# Patient Record
Sex: Female | Born: 1985 | Race: Black or African American | Hispanic: No | Marital: Married | Smoking: Former smoker
Health system: Southern US, Community
[De-identification: ages and names within clinical notes are randomized; demographics above are authoritative.]

## PROBLEM LIST (undated history)

## (undated) ENCOUNTER — Ambulatory Visit: Admission: EM | Payer: Self-pay | Source: Home / Self Care

## (undated) DIAGNOSIS — Z9049 Acquired absence of other specified parts of digestive tract: Secondary | ICD-10-CM

## (undated) DIAGNOSIS — E669 Obesity, unspecified: Secondary | ICD-10-CM

## (undated) DIAGNOSIS — F419 Anxiety disorder, unspecified: Secondary | ICD-10-CM

## (undated) DIAGNOSIS — D649 Anemia, unspecified: Secondary | ICD-10-CM

## (undated) HISTORY — DX: Morbid (severe) obesity due to excess calories: E66.01

## (undated) HISTORY — PX: CHOLECYSTECTOMY: SHX55

## (undated) HISTORY — PX: WISDOM TOOTH EXTRACTION: SHX21

## (undated) HISTORY — DX: Anemia, unspecified: D64.9

---
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
## 2007-08-30 ENCOUNTER — Emergency Department (HOSPITAL_COMMUNITY): Admission: EM | Admit: 2007-08-30 | Discharge: 2007-08-30 | Payer: Self-pay | Admitting: Emergency Medicine

## 2008-10-27 ENCOUNTER — Inpatient Hospital Stay (HOSPITAL_COMMUNITY): Admission: RE | Admit: 2008-10-27 | Discharge: 2008-10-30 | Payer: Self-pay | Admitting: Obstetrics and Gynecology

## 2008-11-23 ENCOUNTER — Emergency Department (HOSPITAL_COMMUNITY): Admission: EM | Admit: 2008-11-23 | Discharge: 2008-11-23 | Payer: Self-pay | Admitting: Emergency Medicine

## 2009-03-27 ENCOUNTER — Emergency Department (HOSPITAL_COMMUNITY): Admission: EM | Admit: 2009-03-27 | Discharge: 2009-03-27 | Payer: Self-pay | Admitting: Emergency Medicine

## 2009-04-30 ENCOUNTER — Emergency Department (HOSPITAL_COMMUNITY): Admission: EM | Admit: 2009-04-30 | Discharge: 2009-04-30 | Payer: Self-pay | Admitting: Emergency Medicine

## 2009-07-22 ENCOUNTER — Ambulatory Visit (HOSPITAL_COMMUNITY): Admission: RE | Admit: 2009-07-22 | Discharge: 2009-07-22 | Payer: Self-pay | Admitting: Obstetrics & Gynecology

## 2009-09-26 ENCOUNTER — Inpatient Hospital Stay (HOSPITAL_COMMUNITY): Admission: AD | Admit: 2009-09-26 | Discharge: 2009-09-26 | Payer: Self-pay | Admitting: Family Medicine

## 2009-09-27 ENCOUNTER — Ambulatory Visit: Payer: Self-pay | Admitting: Advanced Practice Midwife

## 2009-09-27 ENCOUNTER — Inpatient Hospital Stay (HOSPITAL_COMMUNITY): Admission: AD | Admit: 2009-09-27 | Discharge: 2009-09-29 | Payer: Self-pay | Admitting: Obstetrics and Gynecology

## 2010-04-13 ENCOUNTER — Emergency Department (HOSPITAL_COMMUNITY)
Admission: EM | Admit: 2010-04-13 | Discharge: 2010-04-13 | Payer: Self-pay | Source: Home / Self Care | Admitting: Emergency Medicine

## 2010-05-29 ENCOUNTER — Emergency Department (HOSPITAL_COMMUNITY)
Admission: EM | Admit: 2010-05-29 | Discharge: 2010-05-29 | Payer: Self-pay | Source: Home / Self Care | Admitting: Emergency Medicine

## 2010-07-02 ENCOUNTER — Encounter: Payer: Self-pay | Admitting: Obstetrics & Gynecology

## 2010-08-22 LAB — POCT I-STAT, CHEM 8
BUN: 11 mg/dL (ref 6–23)
Calcium, Ion: 1.15 mmol/L (ref 1.12–1.32)
Chloride: 105 mEq/L (ref 96–112)
Creatinine, Ser: 0.7 mg/dL (ref 0.4–1.2)
Glucose, Bld: 105 mg/dL — ABNORMAL HIGH (ref 70–99)
HCT: 34 % — ABNORMAL LOW (ref 36.0–46.0)
Hemoglobin: 11.6 g/dL — ABNORMAL LOW (ref 12.0–15.0)
Potassium: 3.7 mEq/L (ref 3.5–5.1)
Sodium: 140 mEq/L (ref 135–145)
TCO2: 28 mmol/L (ref 0–100)

## 2010-08-22 LAB — URINALYSIS, ROUTINE W REFLEX MICROSCOPIC
Bilirubin Urine: NEGATIVE
Glucose, UA: NEGATIVE mg/dL
Ketones, ur: NEGATIVE mg/dL
Nitrite: NEGATIVE
Protein, ur: 100 mg/dL — AB
Specific Gravity, Urine: 1.025 (ref 1.005–1.030)
Urobilinogen, UA: 0.2 mg/dL (ref 0.0–1.0)
pH: 6 (ref 5.0–8.0)

## 2010-08-22 LAB — RAPID STREP SCREEN (MED CTR MEBANE ONLY): Streptococcus, Group A Screen (Direct): NEGATIVE

## 2010-08-22 LAB — URINE MICROSCOPIC-ADD ON

## 2010-08-22 LAB — POCT PREGNANCY, URINE: Preg Test, Ur: NEGATIVE

## 2010-08-29 LAB — CBC
HCT: 37.1 % (ref 36.0–46.0)
Hemoglobin: 12.7 g/dL (ref 12.0–15.0)
MCHC: 34.3 g/dL (ref 30.0–36.0)
MCV: 86.7 fL (ref 78.0–100.0)
Platelets: 373 10*3/uL (ref 150–400)
RBC: 4.28 MIL/uL (ref 3.87–5.11)
RDW: 15 % (ref 11.5–15.5)
WBC: 11.7 10*3/uL — ABNORMAL HIGH (ref 4.0–10.5)

## 2010-08-29 LAB — RPR: RPR Ser Ql: NONREACTIVE

## 2010-09-13 LAB — COMPREHENSIVE METABOLIC PANEL
ALT: 10 U/L (ref 0–35)
AST: 13 U/L (ref 0–37)
Albumin: 2.8 g/dL — ABNORMAL LOW (ref 3.5–5.2)
Alkaline Phosphatase: 57 U/L (ref 39–117)
BUN: 6 mg/dL (ref 6–23)
CO2: 23 mEq/L (ref 19–32)
Calcium: 8.9 mg/dL (ref 8.4–10.5)
Chloride: 105 mEq/L (ref 96–112)
Creatinine, Ser: 0.53 mg/dL (ref 0.4–1.2)
GFR calc Af Amer: >60 mL/min (ref >60)
GFR calc non Af Amer: >60 mL/min (ref >60)
Glucose, Bld: 87 mg/dL (ref 70–99)
Potassium: 3.9 mEq/L (ref 3.5–5.1)
Sodium: 135 mEq/L (ref 135–145)
Total Bilirubin: 0.3 mg/dL (ref 0.3–1.2)
Total Protein: 6.9 g/dL (ref 6.0–8.3)

## 2010-09-13 LAB — URINE MICROSCOPIC-ADD ON

## 2010-09-13 LAB — URINALYSIS, ROUTINE W REFLEX MICROSCOPIC
Bilirubin Urine: NEGATIVE
Glucose, UA: NEGATIVE mg/dL
Hgb urine dipstick: NEGATIVE
Ketones, ur: NEGATIVE mg/dL
Nitrite: NEGATIVE
Protein, ur: NEGATIVE mg/dL
Specific Gravity, Urine: 1.02 (ref 1.005–1.030)
Urobilinogen, UA: 0.2 mg/dL (ref 0.0–1.0)
pH: 7 (ref 5.0–8.0)

## 2010-09-13 LAB — CBC
HCT: 32.6 % — ABNORMAL LOW (ref 36.0–46.0)
Hemoglobin: 11.3 g/dL — ABNORMAL LOW (ref 12.0–15.0)
MCHC: 34.8 g/dL (ref 30.0–36.0)
MCV: 81.6 fL (ref 78.0–100.0)
Platelets: 325 10*3/uL (ref 150–400)
RBC: 4 MIL/uL (ref 3.87–5.11)
RDW: 16 % — ABNORMAL HIGH (ref 11.5–15.5)
WBC: 9 10*3/uL (ref 4.0–10.5)

## 2010-09-13 LAB — DIFFERENTIAL
Basophils Absolute: 0.1 10*3/uL (ref 0.0–0.1)
Basophils Relative: 1 % (ref 0–1)
Eosinophils Absolute: 0.2 10*3/uL (ref 0.0–0.7)
Neutrophils Relative %: 70 % (ref 43–77)

## 2010-09-13 LAB — URINE CULTURE

## 2010-09-13 LAB — PREGNANCY, URINE: Preg Test, Ur: POSITIVE

## 2010-09-19 LAB — CBC
HCT: 31.6 % — ABNORMAL LOW (ref 36.0–46.0)
HCT: 34.5 % — ABNORMAL LOW (ref 36.0–46.0)
Hemoglobin: 12.2 g/dL (ref 12.0–15.0)
MCHC: 35.3 g/dL (ref 30.0–36.0)
MCV: 83.4 fL (ref 78.0–100.0)
RBC: 3.79 MIL/uL — ABNORMAL LOW (ref 3.87–5.11)
RDW: 16.3 % — ABNORMAL HIGH (ref 11.5–15.5)
WBC: 8.6 10*3/uL (ref 4.0–10.5)

## 2010-09-19 LAB — CREATININE, SERUM: GFR calc Af Amer: >60 mL/min (ref >60)

## 2010-09-19 LAB — TYPE AND SCREEN: Antibody Screen: NEGATIVE

## 2010-10-24 NOTE — Discharge Summary (Signed)
NAMELEDORA, Kristen Kline          ACCOUNT NO.:  [id number]   MEDICAL RECORD NO.:  [id number]          PATIENT TYPE:  INP   LOCATION:  9123                          FACILITY:  [facility]   PHYSICIAN:  Tilda Burrow, M.D. DATE OF BIRTH:  Sep 08, 1985   DATE OF ADMISSION:  10/27/2008  DATE OF DISCHARGE:  10/30/2008                               DISCHARGE SUMMARY   ADMITTING DIAGNOSES:  Pregnancy, [redacted] weeks gestation, persistent breech  presentation, and morbid obesity.   DISCHARGE DIAGNOSES:  Pregnancy 39 weeks 2, delivered, persistent double  footling breech presentation, and morbid obesity.   PROCEDURE:  Primary low-transverse cervical cesarean section, J.VEmelda Fear, Oct 27, 2008.   DISCHARGE MEDICATIONS:  1. Percocet 5/325 #40 one p.o. q.6 h. p.r.n. pain.  2. Colace 100 mg p.o. b.i.d.  3. Ibuprofen 600 mg #30 one p.o. q.6 h. p.r.n. mild pain.  4. Prenatal vitamins p.o. daily.   Follow up with [facility] in 2 weeks and in 6 weeks.   HOSPITAL SUMMARY:  Kristen Kline was admitted as described in the HPI.  After prenatal course, followed with [facility].  Blood type is  O positive and hemoglobin 12, hematocrit 34.5, RPR nonreactive.  She had  a primary cesarean section without difficulty, delivering a healthy 7-  pound 12-ounce female infant, Apgars 8 and 9, double footling breech  presentation with 300 mL EBL.  Postpartum hemoglobin returned to 11.6,  hematocrit 31.6, white count 8600.  She breastfed the baby with proper  supplementation with the patient planning to consider Implanon for  future contraception.  She will return to our office in 2 weeks or  earlier p.r.n. fever, chills, bleeding, or outpatient concerns.      Tilda Burrow, M.D.  Electronically Signed     JVF/MEDQ  D:  10/30/2008  T:  10/31/2008  Job:  [id number]   cc:   [facility]

## 2010-10-24 NOTE — Discharge Summary (Signed)
NAMEDALANA, Kristen Kline          ACCOUNT NO.:  [id number]   MEDICAL RECORD NO.:  [id number]          PATIENT TYPE:  INP   LOCATION:  9123                          [facility]   PHYSICIAN:  Tilda Burrow, M.D. DATE OF BIRTH:  November 29, 1985   DATE OF ADMISSION:  10/27/2008  DATE OF DISCHARGE:  10/30/2008                               DISCHARGE SUMMARY   ADDENDUM   After further evaluation and assessment, it was discovered the patient  does not have staples and had subcuticular closure of the skin;  therefore, followup will be in 2 weeks postpartum for eval.      Sid Falcon, CNM      Tilda Burrow, M.D.  Electronically Signed    WM/MEDQ  D:  10/30/2008  T:  10/31/2008  Job:  [id number]

## 2010-10-24 NOTE — H&P (Signed)
NAMESHARRI, LOYA          ACCOUNT NO.:  [id number]   MEDICAL RECORD NO.:  [id number]          PATIENT TYPE:  AMB   LOCATION:  SDC                           [facility]  [facility]   PHYSICIAN:  Tilda Burrow, M.D. DATE OF BIRTH:  05-19-1986   DATE OF ADMISSION:  DATE OF DISCHARGE:                              HISTORY & PHYSICAL   ADMITTING DIAGNOSES:  1. Pregnancy, 39 weeks 2 days.  2. Persistent breech presentation.  3. Morbid obesity, scheduled for primary cesarean section on Oct 28, 2007.   HISTORY OF PRESENT ILLNESS:  This is a 25 year old female gravida 1,  para 0, LMP unknown with 24-week ultrasound suggesting EDC of Nov 01, 2008, with serial ultrasound showing steady growth corresponding to that  ultrasound.  She had been followed since 24 weeks at [facility].  At this point, she has a persistent breech presentation, which is not  felt to be amendable to externalversion given her significant obesity  and body mass index of 60, height 5 feet 1 inch, and weight 316.  She  has normal fluid volume on last ultrasound with estimated fetal weight  at 37 weeks of 3026 g.  It has been confirmed again as being breech on  Oct 25, 2008.   PRENATAL COURSE:  An 18-pound weight gain, normal blood pressures,  proteinuria running trace to 1+ throughout the pregnancy felt to  represent vaginal contamination with no cervical changes.  Cervix  remains long, thick, and closed.   Blood type O positive, rubella immunity present, varicella immunity  present.  Urine drug screen negative.  Hemoglobin 10, hematocrit 32,  platelets 426,000 on intake.  Assessment:  Hepatitis, HIV, RPR, GC, and  Chlamydia all negative, with 36-week labs including group B strep  positive and repeat GC and Chlamydia negative as well as negative HIV  and RPR.  She desires to breast feed and bottle supplement and is  considering condoms versus Implanon as the future contraception.  If it  is a female, she  desires future circumcision.   PAST MEDICAL HISTORY:  Benign other than morbid obesity with BMI of 60.   SURGICAL HISTORY:  Laparoscopic cholecystectomy.   ALLERGIES:  None.   SOCIAL HISTORY:  Single, lives with parents, unemployed.  Family  planning negative.   FAMILY HISTORY:  Positive for breast cancer in grandmother, second-  degree relative.   PHYSICAL EXAMINATION:  GENERAL:  She is a morbidly obese, otherwise  healthy, large-framed African American female.  HEENT:  Pupils are equal, round, and reactive.  NECK:  Supple.  LUNGS:  Clear to auscultation.  ABDOMEN:  A 44-cm fundal height, breech presentation once again  confirmed.  Fetal heart rate of 138 with cervix long, thick, and closed.   PLAN:  Primary cesarean section 1:30 p.m. on Oct 27, 2008.      Tilda Burrow, M.D.  Electronically Signed     JVF/MEDQ  D:  10/26/2008  T:  10/27/2008  Job:  [id number]   cc:   [facility]

## 2010-10-24 NOTE — Op Note (Signed)
NAMESABRINE, PATCHEN          ACCOUNT NO.:  [id number]   MEDICAL RECORD NO.:  [id number]          PATIENT TYPE:  INP   LOCATION:  9199                          [facility]   PHYSICIAN:  Tilda Burrow, M.D. DATE OF BIRTH:  Dec 19, 1985   DATE OF PROCEDURE:  DATE OF DISCHARGE:                               OPERATIVE REPORT   PREOPERATIVE DIAGNOSES:  Pregnancy 39 weeks two days, persistent breech  presentation.   POSTOPERATIVE DIAGNOSES:  Double footling breech presentation, pregnancy  39.2 weeks, delivered.   PROCEDURE:  Primary low transverse cervical cesarean section.   SURGEON:  Tilda Burrow, MD   ASSISTANT:  Lyman Bishop   ANESTHESIA:  Spinal.   COMPLICATIONS:  None.   FINDINGS:  7 pound 12 ounce female, Apgars 8, 9 double footling breech  presentation.   ESTIMATED BLOOD LOSS:  300 mL.   DETAILS OF PROCEDURE:  The patient was taken to the operating room.  Spinal anesthesia introduced and the patient prepped and draped with  Foley catheter in place.  Time-out was conducted.  Pfannenstiel-type  incision was performed, a 20 cm long skin incision and sharp Bovie  dissection to the skin.  Fascia opened transversely and peritoneal  cavity entered bluntly.  Bladder flap developed on the lower uterine  segment.  Transverse uterine incision performed without difficulty,  clear amniotic fluid encountered.  Double footling breech encountered  and breech extraction performed with fundal pressure used to guide the  vertex.  The vertex delivered easily.  There was no traction on the neck  at all.  Baby was passed to the pediatrician for continuation of care.  See their notes for details.  Apgars 8 and 9 were assigned and weight  returned at 7 pounds 12 ounces.  Placenta delivered easily in response  to uterine massage and IV oxytocin, and there was minimal bleeding.  The  uterine closure was single layer of running looking closure with 0  chromic.  Bladder flap was  reapproximated with 2-0 chromic.  Hemostasis  was checked carefully and was excellent.  Laparotomy drapes were removed  and the anterior peritoneum closed with running 2-0  chromic.  The fascia was closed with running 0 Vicryl.  Subcu tissues  were reapproximated with 3 interrupted sutures of 2-0 Vicryl and then  subcuticular 3-0 Vicryl closures of the skin completed the procedure.  Sponge and needle counts were correct.  The patient tolerated the  procedure well, went to recovery room in good condition.      Tilda Burrow, M.D.  Electronically Signed     JVF/MEDQ  D:  10/27/2008  T:  10/28/2008  Job:  [id number]   cc:   [facility]

## 2010-10-24 NOTE — Discharge Summary (Signed)
Kristen Kline, Kristen Kline          ACCOUNT NO.:  [id number]   MEDICAL RECORD NO.:  [id number]          PATIENT TYPE:  INP   LOCATION:  9123                          [facility]   PHYSICIAN:  Tilda Burrow, M.D. DATE OF BIRTH:  [birth year]   DATE OF ADMISSION:  10/27/2008  DATE OF DISCHARGE:  10/30/2008                               DISCHARGE SUMMARY   REASON FOR HOSPITALIZATION:  The patient was admitted on Oct 27, 2008,  for persistent footling breech and for a primary low-transverse C-  section.   PERTINENT LABORATORY DATA:  Hemoglobin day after procedure 11.1 and  hematocrit 31.6.   FINAL DIAGNOSES:  Primary low-transverse cesarean section, cesarean  delivery of a female infant.   PROCEDURES PERFORMED:  Primary low-transverse cesarean section.   SIGNIFICANT FINDINGS:  Delivery of a female infant, 7 pounds 12 ounces.  Apgars 8 and 9.  EBL 300 mL.  The patient's postpartum course was  unremarkable.   PROCEDURES PERFORMED:  The patient had a primary low transverse cesarean  section.   CONDITION OF THE PATIENT ON DISCHARGE:  The patient's vital signs  stable, ambulating without difficulty, tolerating p.o. fluids, voiding,  and bottle feeding.  Instructions given to family.  The patient is  discharged home on Oct 30, 2008.  Advised to avoid lifting greater than  10 pounds.  Report any signs of increased bleeding or infection.  Recommended condom use each time desire.  She will obtain Implanon at a  6-week postpartum visit.  Medications given Percocet total #40.  We will  followup on Monday to have staples removed.      Sid Falcon, CNM      Tilda Burrow, M.D.  Electronically Signed    WM/MEDQ  D:  10/30/2008  T:  10/31/2008  Job:  [id number]

## 2010-10-27 NOTE — Op Note (Signed)
[name], Kristen Kline          ACCOUNT NO.:  [id number]   MEDICAL RECORD NO.:  [id number]          PATIENT TYPE:  AMB   LOCATION:  DAY                           FACILITY:  [facility]   PHYSICIAN:  Dalia Heading, M.D.  DATE OF BIRTH:  1985/09/14   DATE OF PROCEDURE:  03/13/2006  DATE OF DISCHARGE:                                 OPERATIVE REPORT   PREOPERATIVE DIAGNOSIS:  Cholecystitis, cholelithiasis.   POSTOPERATIVE DIAGNOSIS:  Cholecystitis, cholelithiasis.   PROCEDURE:  Laparoscopic cholecystectomy.   SURGEON:  Dr. Franky Macho   ASSISTANT:  Dr. Arna Snipe   ANESTHESIA:  General endotracheal.   INDICATIONS:  The patient is a 25 year old black female presents with  cholecystitis secondary to cholelithiasis.  Risks and benefits of the  procedure including bleeding, infection, hepatobiliary injury, the  possibility f an open procedure were fully explained to the patient and  mother, who gave informed consent for the patient.  The patient is a minor.   PROCEDURE NOTE:  The patient is placed in supine position.  After induction  of general endotracheal anesthesia, the abdomen was prepped and draped using  the usual sterile technique with Betadine.  Surgical site confirmation was  performed.   A supraumbilical incision was made down to the fascia.  A Veress needle was  introduced into the abdominal cavity and confirmation of placement was done  using the saline drop test.  The abdomen was then insufflated to 16 mmHg  pressure.  11 mm trocar was introduced into the abdominal cavity under  direct visualization without difficulty.  The patient was placed in reverse  Trendelenburg position.  Additional 11 mm trocar was placed in the  epigastric region and 5-mm trocars placed in the right upper quadrant and  right flank regions.  Liver was inspected and noted to be within normal  limits.  The gallbladder was retracted superior and laterally.  Dissection  was begun around the  infundibulum of the gallbladder.  The cystic duct was  first identified.  Juncture to the infundibulum fully identified.  Endoclips  were placed proximally and distally on the cystic duct and cystic duct was  divided.  This was likewise done on the cystic artery.  The gallbladder was  then freed away from the gallbladder fossa using Bovie electrocautery.  The  gallbladder was delivered through the epigastric trocar site using EndoCatch  bag.  Gallbladder fossa was inspected.  No abnormal bleeding or bile leakage  was noted.  Surgicel was placed in the gallbladder fossa.  All fluid and air  were then evacuated from the abdominal cavity prior to removal of the  trocars.   All wounds were irrigated normal saline.  All wounds were injected with 0.5%  Sensorcaine.  The supraumbilical fascia as well as epigastric fascia  reapproximated using O Vicryl interrupted sutures.  All skin incisions were  closed using staples.  Betadine ointment and dry sterile dressings were  applied.   All counts correct at the end of the procedure.  The patient was extubated  in the operating room went back to recovery room awake in stable condition.  Complications none.  SPECIMEN:  Gallbladder with stones.   BLOOD LOSS:  Minimal.      Dalia Heading, M.D.  Electronically Signed     MAJ/MEDQ  D:  03/13/2006  T:  03/14/2006  Job:  [id number]   cc:   Dr. Timoteo Expose

## 2010-10-27 NOTE — H&P (Signed)
NAMEMENA, SIMONIS          ACCOUNT NO.:  [id number]   MEDICAL RECORD NO.:  [id number]          PATIENT TYPE:  AMB   LOCATION:  DAY                           FACILITY:  [facility]   PHYSICIAN:  Dalia Heading, M.D.  DATE OF BIRTH:  [birth year]   DATE OF ADMISSION:  03/13/2006  DATE OF DISCHARGE:  LH                                HISTORY & PHYSICAL   CHIEF COMPLAINT:  Cholecystitis, cholelithiasis.   HISTORY OF PRESENT ILLNESS:  The patient is a 25 year old black female who  is referred for evaluation and treatment of biliary colic secondary to  cholelithiasis.  She has been having right upper quadrant abdominal pain,  nausea, bloating for several weeks.  She does have fatty food intolerance.  No fever, chills, or jaundice have been noted.   PAST MEDICAL HISTORY:  Unremarkable.   PAST SURGICAL HISTORY:  Unremarkable.   CURRENT MEDICATIONS:  None.   ALLERGIES:  NO KNOWN DRUG ALLERGIES.   REVIEW OF SYSTEMS:  Noncontributory.   PHYSICAL EXAMINATION:  GENERAL:  The patient is an obese black female in no  acute distress.  HEENT:  Examination reveals no scleral icterus.  LUNGS:  Clear to auscultation with equal breath sounds bilaterally.  HEART:  Examination reveals a regular rate and rhythm without S3, S4, or  murmurs.  ABDOMEN:  Soft, nondistended.  She is tender in the right upper quadrant to  palpation.  No hepatosplenomegaly, masses, or hernias are identified.  Ultrasound of the gallbladder reveals cholelithiasis with a normal common  bile duct.   IMPRESSION:  1. Cholecystitis.  2. Cholelithiasis.   PLAN:  The patient is scheduled for a laparoscopic cholecystectomy on  March 13, 2006.  The risks and benefits of the procedure including  bleeding, infection, hepatobiliary injury, and the possibility of an open  procedure were fully explained to the patient and mother, who gave informed  consent for the patient; the patient is a minor.      Dalia Heading, M.D.  Electronically Signed     MAJ/MEDQ  D:  03/12/2006  T:  03/13/2006  Job:  [id number]   cc:   Kirk Ruths, M.D.  Fax: [phone]   [facility]

## 2011-02-03 ENCOUNTER — Emergency Department (HOSPITAL_COMMUNITY): Payer: 59

## 2011-02-03 ENCOUNTER — Encounter: Payer: Self-pay | Admitting: Emergency Medicine

## 2011-02-03 ENCOUNTER — Emergency Department (HOSPITAL_COMMUNITY)
Admission: EM | Admit: 2011-02-03 | Discharge: 2011-02-03 | Disposition: A | Discharge status: Home / Self Care | Payer: 59 | Attending: Emergency Medicine | Admitting: Emergency Medicine

## 2011-02-03 DIAGNOSIS — S300XXA Contusion of lower back and pelvis, initial encounter: Secondary | ICD-10-CM | POA: Insufficient documentation

## 2011-02-03 DIAGNOSIS — S9030XA Contusion of unspecified foot, initial encounter: Secondary | ICD-10-CM | POA: Insufficient documentation

## 2011-02-03 DIAGNOSIS — Y92009 Unspecified place in unspecified non-institutional (private) residence as the place of occurrence of the external cause: Secondary | ICD-10-CM | POA: Insufficient documentation

## 2011-02-03 DIAGNOSIS — W108XXA Fall (on) (from) other stairs and steps, initial encounter: Secondary | ICD-10-CM | POA: Insufficient documentation

## 2011-02-03 MED ORDER — NAPROXEN 500 MG PO TABS: 500.0000 mg | ORAL_TABLET | Freq: Two times a day (BID) | ORAL | Status: AC

## 2011-02-03 MED ORDER — HYDROCODONE-ACETAMINOPHEN 5-325 MG PO TABS: ORAL_TABLET | ORAL | Status: AC

## 2011-02-03 NOTE — ED Provider Notes (Signed)
History     CSN: [id number] Arrival date & time: 02/03/2011 12:20 PM  Chief Complaint  Patient presents with  . Tailbone Pain  . Toe Pain   HPI Comments: Patient states she tripped and fell down several steps at her home.  Landed on her lower back and buttocks.  States the pain in her lower back is sharp and radiates into her left leg.  She also c/o pain to her left great toe that began after the fall.  She denies LOC, neck pain, headaches or vomting  Patient is a 25 y.o. female presenting with fall. The history is provided by the patient.  Fall The accident occurred yesterday. The fall occurred while walking. She fell from a height of 3 to 5 ft. She landed on carpet. There was no blood loss. Point of impact: lower back and buttocks. Pain location: low back and buttocks, left great toe. The pain is moderate. She was ambulatory at the scene. There was no entrapment after the fall. There was no drug use involved in the accident. Pertinent negatives include no visual change, no fever, no numbness, no abdominal pain, no bowel incontinence, no nausea, no vomiting, no hematuria, no headaches, no hearing loss, no loss of consciousness and no tingling. The symptoms are aggravated by standing, pressure on the injury and ambulation. She has tried nothing for the symptoms. The treatment provided no relief.    History reviewed. No pertinent past medical history.  Past Surgical History  Procedure Date  . Cholecystectomy   . Cesarean section     Family History  Problem Relation Age of Onset  . Heart failure Mother   . Diabetes Mother   . Hypertension Father   . Stroke Other     History  Substance Use Topics  . Smoking status: Never Smoker   . Smokeless tobacco: Never Used  . Alcohol Use: No    OB History    Grav Para Term Preterm Abortions TAB SAB Ect Mult Living   2 2 2       2       Review of Systems  Constitutional: Negative for fever and appetite change.  HENT: Negative for facial  swelling, neck pain and neck stiffness.   Gastrointestinal: Negative for nausea, vomiting, abdominal pain and bowel incontinence.  Genitourinary: Negative for hematuria.  Musculoskeletal: Positive for back pain and arthralgias. Negative for gait problem.  Skin: Negative.   Neurological: Negative for dizziness, tingling, loss of consciousness, weakness, numbness and headaches.  Hematological: Negative for adenopathy. Does not bruise/bleed easily.  Psychiatric/Behavioral: Negative for confusion.  All other systems reviewed and are negative.    Physical Exam  BP 128/73  Pulse 78  Temp(Src) 98.4 F (36.9 C) (Oral)  Resp 18  Ht 5\' 6"  (1.676 m)  Wt 250 lb (113.399 kg)  BMI 40.35 kg/m2  SpO2 100%  LMP 12/15/2010  Physical Exam  Nursing note and vitals reviewed. Constitutional: She appears well-developed and well-nourished. No distress.  HENT:  Head: Normocephalic and atraumatic.  Eyes: EOM are normal. Pupils are equal, round, and reactive to light.  Neck: Neck supple. No thyromegaly present.  Cardiovascular: Normal rate, regular rhythm and normal heart sounds.   Pulmonary/Chest: Effort normal and breath sounds normal.  Abdominal: Soft. There is no tenderness. There is no rebound and no guarding.  Musculoskeletal: She exhibits tenderness. She exhibits no edema.       Lumbar back: She exhibits bony tenderness. She exhibits normal range of motion, no edema, no  deformity and normal pulse.       Left foot: She exhibits tenderness. She exhibits no swelling, normal capillary refill, no crepitus and no deformity.       Feet:  Lymphadenopathy:    She has no cervical adenopathy.  Neurological: She is alert. She has normal reflexes. She is not disoriented. No cranial nerve deficit or sensory deficit. She exhibits normal muscle tone. Coordination and gait normal.  Reflex Scores:      Patellar reflexes are 2+ on the right side and 2+ on the left side.      Achilles reflexes are 2+ on the  right side and 2+ on the left side. Skin: Skin is warm and dry.    ED Course  ORTHOPEDIC INJURY TREATMENT Date/Time: 02/03/2011 2:54 PM Performed by: Trisha Mangle, Kalev Temme L. Authorized by: Pollyann Savoy Consent: Verbal consent obtained. Written consent not obtained. Consent given by: patient Patient understanding: patient states understanding of the procedure being performed Patient consent: the patient's understanding of the procedure matches consent given Procedure consent: procedure consent matches procedure scheduled Imaging studies: imaging studies available Patient identity confirmed: verbally with patient Time out: Immediately prior to procedure a "time out" was called to verify the correct patient, procedure, equipment, support staff and site/side marked as required. Injury location: foot Location details: left foot Injury type: soft tissue Pre-procedure neurovascular assessment: neurovascularly intact Pre-procedure distal perfusion: normal Pre-procedure neurological function: normal Pre-procedure range of motion: normal Local anesthesia used: no Patient sedated: no Immobilization: post op boot. Post-procedure neurovascular assessment: post-procedure neurovascularly intact Post-procedure distal perfusion: normal Post-procedure neurological function: normal Post-procedure range of motion: normal Patient tolerance: Patient tolerated the procedure well with no immediate complications.    MDM   1450  ttp of the coccyx, no edema , bruising, or abrasion.  Also has tenderness to the left great toe and distal foot.  Dp pulse is strong, sensation intact,  CR< 2 sec.  Left great toe and second toe were buddy taped prior to ED arrival.      The patient appears reasonably screened and/or stabilized for discharge and I doubt any other medical condition or other [facility] requiring further screening, evaluation, or treatment in the ED at this time prior to discharge.      Dg  Sacrum/coccyx  02/03/2011  *RADIOLOGY REPORT*  Clinical Data: Sacral pain.  Fall.  SACRUM AND COCCYX - 2+ VIEW  Comparison: Lumbar spine exam from 05/29/2010  Findings: No evidence for fracture.  SI joints are normal.  IMPRESSION: Normal exam.  Original Report Authenticated By: ERIC A. MANSELL, M.D.   Dg Toe Great Left  02/03/2011  *RADIOLOGY REPORT*  Clinical Data: Pain post falling  LEFT TOE - 2+ VIEW  Comparison: None.  Findings: Negative for fracture, dislocation, or other acute abnormality.  Normal alignment and mineralization. No significant degenerative change.  Regional soft tissues unremarkable.  IMPRESSION:  Negative  Original Report Authenticated By: Osa Craver, M.D.        Kmari Brian L. Zooey Schreurs, [place] 02/09/11 1622

## 2011-02-03 NOTE — ED Notes (Signed)
Patient reports falling down stairs in apartment and hitting buttock. Patient c/o tailbone pain that radiates down left leg. Patient also c/o left big toe pain. Patient has buddy taped toes at this time.

## 2011-02-03 NOTE — ED Notes (Signed)
Post op shoe applied to L foot.

## 2011-02-14 NOTE — ED Provider Notes (Signed)
Medical screening examination/treatment/procedure(s) were performed by non-physician practitioner and as supervising physician I was immediately available for consultation/collaboration.   Akacia Boltz B. Anjel Pardo, MD 02/14/11 0631 

## 2011-04-06 ENCOUNTER — Encounter (HOSPITAL_COMMUNITY): Payer: Self-pay

## 2011-04-06 ENCOUNTER — Emergency Department (HOSPITAL_COMMUNITY)
Admission: EM | Admit: 2011-04-06 | Discharge: 2011-04-06 | Disposition: A | Discharge status: Home / Self Care | Payer: 59 | Attending: Emergency Medicine | Admitting: Emergency Medicine

## 2011-04-06 DIAGNOSIS — R51 Headache: Secondary | ICD-10-CM

## 2011-04-06 HISTORY — DX: Anxiety disorder, unspecified: F41.9

## 2011-04-06 LAB — GLUCOSE, CAPILLARY: Glucose-Capillary: 81 mg/dL (ref 70–99)

## 2011-04-06 LAB — POCT PREGNANCY, URINE: Preg Test, Ur: NEGATIVE

## 2011-04-06 MED ORDER — ONDANSETRON 8 MG PO TBDP
8.0000 mg | ORAL_TABLET | Freq: Once | ORAL | Status: AC
  Administered 2011-04-06: 8 mg via ORAL
  Filled 2011-04-06: qty 1

## 2011-04-06 MED ORDER — OXYCODONE-ACETAMINOPHEN 5-325 MG PO TABS
2.0000 | ORAL_TABLET | Freq: Once | ORAL | Status: AC
  Administered 2011-04-06: 2 via ORAL
  Filled 2011-04-06: qty 2

## 2011-04-06 NOTE — ED Notes (Signed)
Pt presents with headache, nausea, and dizziness since 1300 today. Pt was at work when symptoms started. Pt states she has felt pregnant as well but took a home pregnancy test and it was negative. NAD at this time. Pt to triage via W/C.

## 2011-04-06 NOTE — ED Provider Notes (Signed)
History   This chart was scribed for Joya Gaskins, MD by Clarita Crane. The patient was seen in room APA05/APA05 and the patient's care was started at 4:07PM.   CSN: [id number] Arrival date & time: 04/06/2011  3:45 PM   First MD Initiated Contact with Patient 04/06/11 1604      Chief Complaint  Patient presents with  . Headache  . Dizziness  . Nausea   HPI Kristen Kline is a 25 y.o. female who presents to the Emergency Department complaining of constant moderate HA onset several hours ago following onset of blurred vision and persistent since with associated nausea and an episode of diaphoresis. Denies dysuria, weakness, numbness, loss of vision, photophobia, recent head injury and h/o similar symptoms. Patient reports she recently began eating "sweets" again after a period of dieting and avoiding sugar. Also states she has concern for possibly being pregnant but had taken a home pregnancy test which displayed negative results. Patient is a non-smoker and denies elicit drug abuse.  Past Medical History  Diagnosis Date  . Anxiety     Past Surgical History  Procedure Date  . Cholecystectomy   . Cesarean section     Family History  Problem Relation Age of Onset  . Heart failure Mother   . Diabetes Mother   . Hypertension Father   . Stroke Other     History  Substance Use Topics  . Smoking status: Never Smoker   . Smokeless tobacco: Never Used  . Alcohol Use: No    OB History    Grav Para Term Preterm Abortions TAB SAB Ect Mult Living   2 2 2       2       Review of Systems 10 Systems reviewed and are negative for acute change except as noted in the HPI.  Allergies  Review of patient's allergies indicates no known allergies.  Home Medications   Current Outpatient Rx  Name Route Sig Dispense Refill  . IMPLANON Lane Subcutaneous Inject into the skin.      Marland Kitchen NAPROXEN 500 MG PO TABS Oral Take 1 tablet (500 mg total) by mouth 2 (two) times daily. 20 tablet  0    BP 117/69  Pulse 76  Temp(Src) 97.8 F (36.6 C) (Oral)  Resp 18  Ht 5\' 6"  (1.676 m)  Wt 260 lb (117.935 kg)  BMI 41.97 kg/m2  SpO2 100%  LMP 03/18/2011  Physical Exam CONSTITUTIONAL: Well developed/well nourished HEAD AND FACE: Normocephalic/atraumatic EYES: EOMI/PERRL, fundoscopic exam normal ENMT: Mucous membranes moist NECK: supple no meningeal signs, no carotid bruits noted bilaterally, no cervical lymphadenopathy, c-spine non-tender CV: S1/S2 noted, no murmurs/rubs/gallops noted LUNGS: Lungs are clear to auscultation bilaterally, no apparent distress ABDOMEN: soft, nontender, non-distended NEURO: Pt is awake/alert, moves all extremitiesx4, no past pointing, Awake/alert, face is symmetric, no arm or leg drift is noted, Cranial nerves 3/4/5/6/12/17/08/11/12 tested and intact, Gait normal EXTREMITIES: pulses normal, full ROM SKIN: warm, color normal PSYCH: no abnormalities of mood noted  ED Course  Procedures (including critical care time)  DIAGNOSTIC STUDIES: Oxygen Saturation is 100% on room air, normal by my interpretation.    COORDINATION OF CARE:     Labs Reviewed  POCT PREGNANCY, URINE  POCT CBG MONITORING      MDM  Nursing notes reviewed and considered in documentation All labs/vitals reviewed and considered  Pt well appearing, smiling, using cellphone, feels improved, no neuro deficits, denies visual loss        I  personally performed the services described in this documentation, which was scribed in my presence. The recorded information has been reviewed and considered.   Joya Gaskins, MD 04/06/11 1710

## 2011-04-06 NOTE — ED Notes (Signed)
C/o blurred vision, seeing "like a light flashing in my right eye", and headache onset at 1300 today at work; reports nausea; denies vomiting. A&ox4; answers questions appropriately; ambulatory with steady gait; to bathroom with minimal assistance; urine specimen collected.

## 2013-02-03 ENCOUNTER — Encounter: Payer: Self-pay | Admitting: Adult Health

## 2013-02-20 ENCOUNTER — Encounter: Payer: Self-pay | Admitting: Adult Health

## 2013-03-13 ENCOUNTER — Encounter: Payer: Self-pay | Admitting: Adult Health

## 2013-03-13 ENCOUNTER — Ambulatory Visit (INDEPENDENT_AMBULATORY_CARE_PROVIDER_SITE_OTHER): Payer: 59 | Admitting: Adult Health

## 2013-03-13 VITALS — BP 118/80 | Ht 66.0 in | Wt 312.0 lb

## 2013-03-13 DIAGNOSIS — Z3046 Encounter for surveillance of implantable subdermal contraceptive: Secondary | ICD-10-CM

## 2013-03-13 NOTE — Progress Notes (Signed)
Subjective:     Patient ID: Kristen Kline, female   DOB: 28-Nov-1985, 27 y.o.   MRN: [id number]  HPI Kristen Kline is a 27 year old black female in for implanon removal.  Review of Systems See HPI Reviewed past medical,surgical, social and family history. Reviewed medications and allergies.     Objective:   Physical Exam BP 118/80  Ht 5\' 6"  (1.676 m)  Wt 312 lb (141.522 kg)  BMI 50.38 kg/m2  LMP 03/02/2013  Left arm cleansed with betadine, and injected with 1.5 cc 2% lidocaine and waited til numb.Under sterile technique a #11 blade was used to make small vertical incision, and a curved forceps was used to easily remove rod. Steri strips applied. Pressure dressing applied. Assessment:     Implanon removal    Plan:     Use condoms, keep clean and dry x 24 hours, no heavy lifting, keep steri strips on x 72 hours, Keep pressure dressing on x 24 hours. Follow up prn problems.   Pap in 3 months

## 2013-03-13 NOTE — Patient Instructions (Addendum)
Use condoms,  keep clean and dry x 24 hours, no heavy lifting, keep steri strips on x 72 hours, Keep pressure dressing on x 24 hours. Follow up prn problems. Pap in 3 months  

## 2014-04-12 ENCOUNTER — Encounter: Payer: Self-pay | Admitting: Adult Health

## 2014-12-08 ENCOUNTER — Encounter: Payer: Self-pay | Admitting: Advanced Practice Midwife

## 2014-12-08 ENCOUNTER — Other Ambulatory Visit: Payer: Medicaid Other | Admitting: Advanced Practice Midwife

## 2015-05-16 ENCOUNTER — Ambulatory Visit: Payer: Medicaid Other | Admitting: Adult Health

## 2015-05-30 ENCOUNTER — Encounter: Payer: Self-pay | Admitting: Obstetrics and Gynecology

## 2015-05-30 ENCOUNTER — Ambulatory Visit (INDEPENDENT_AMBULATORY_CARE_PROVIDER_SITE_OTHER): Payer: Medicaid Other | Admitting: Obstetrics and Gynecology

## 2015-05-30 VITALS — BP 110/82 | Ht 66.0 in | Wt 322.0 lb

## 2015-05-30 DIAGNOSIS — Z6841 Body Mass Index (BMI) 40.0 and over, adult: Secondary | ICD-10-CM

## 2015-05-30 DIAGNOSIS — R350 Frequency of micturition: Secondary | ICD-10-CM

## 2015-05-30 DIAGNOSIS — N390 Urinary tract infection, site not specified: Secondary | ICD-10-CM | POA: Diagnosis not present

## 2015-05-30 HISTORY — DX: Urinary tract infection, site not specified: N39.0

## 2015-05-30 LAB — POCT URINALYSIS DIPSTICK
Blood, UA: 4
Glucose, UA: NEGATIVE
KETONES UA: NEGATIVE
Nitrite, UA: POSITIVE

## 2015-05-30 NOTE — Progress Notes (Addendum)
Patient ID: Kristen Kline, female   DOB: 11-17-1985, 29 y.o.   MRN: [id number]   [facility]  Patient name: Kristen Kline MRN [id number]  Date of birth: 11-17-1985  CC & HPI:  Kristen Kline is a 29 y.o. female with h/o cesarean section presenting today for moderate, gradually worsening frequency (2x in the night, frequently during the day) and decreased urine volume for a month. She states associated subjective fever, lower back pain. Pt notes she has taken Ibuprofen with mild to moderate relief of pain. She denies dysuria, known hematuria, nausea, emesis.   ROS:  A complete 10 system review of systems was obtained and all systems are negative except as noted in the HPI and PMH.    Pertinent History Reviewed:   Reviewed: Significant for cesarean section  Medical         Past Medical History  Diagnosis Date  . Anxiety                               Surgical Hx:    Past Surgical History  Procedure Laterality Date  . Cholecystectomy    . Cesarean section    . Wisdom tooth extraction     Medications: Reviewed & Updated - see associated section                       Current outpatient prescriptions:  Marland Kitchen.  Multiple Vitamin (MULTIVITAMIN) tablet, Take 1 tablet by mouth daily., Disp: , Rfl:    Social History: Reviewed -  reports that she has quit smoking. She has never used smokeless tobacco.  Objective Findings:  Vitals: Blood pressure 110/82, height 5\' 6"  (1.676 m), weight 322 lb (146.058 kg), last menstrual period 05/25/2015. Body mass index is 52 kg/(m^2).   Physical Examination: General appearance - alert, well appearing, and in no distress Mental status - alert, oriented to person, place, and time Abdomen - soft, nontender, nondistended, no masses or organomegaly; mild suprapubic discomfort    Assessment & Plan:   A:  1. Suspected UTI indicated by UA positive for nitrates, trace protein, trace leukocytes and blood  P:  1. Will prescribe  7 days of Septra  2. Follow up with worsening symptoms    By signing my name below, I, Doreatha MartinEva Mathews, attest that this documentation has been prepared under the direction and in the presence of Tilda BurrowJohn Severiano Utsey V, MD. Electronically Signed: Doreatha MartinEva Mathews, ED Scribe. 05/30/2015. 4:27 PM.  I personally performed the services described in this documentation, which was SCRIBED in my presence. The recorded information has been reviewed and considered accurate. It has been edited as necessary during review. Tilda BurrowFERGUSON,Marshae Azam V, MD

## 2015-05-30 NOTE — Progress Notes (Signed)
Patient ID: Kristen Kline, female   DOB: 06-09-1986, 29 y.o.   MRN: [id number] Pt here today for urinary frequency and the urge to go but didn't go a lot. Pt states that she has had symptoms for a few months but they have just gotten worse.

## 2015-05-31 ENCOUNTER — Telehealth: Payer: Self-pay | Admitting: Obstetrics and Gynecology

## 2015-05-31 NOTE — Telephone Encounter (Signed)
Pt called stating that she did not get her medication sent to the pharmacy. Please contact pt

## 2015-06-01 ENCOUNTER — Other Ambulatory Visit: Payer: Self-pay | Admitting: Obstetrics and Gynecology

## 2015-06-01 DIAGNOSIS — N390 Urinary tract infection, site not specified: Secondary | ICD-10-CM

## 2015-06-01 LAB — URINE CULTURE

## 2015-06-01 MED ORDER — NITROFURANTOIN MONOHYD MACRO 100 MG PO CAPS: 100.0000 mg | ORAL_CAPSULE | Freq: Two times a day (BID) | ORAL | Status: AC

## 2015-06-01 MED ORDER — SULFAMETHOXAZOLE-TRIMETHOPRIM 800-160 MG PO TABS: 1.0000 | ORAL_TABLET | Freq: Two times a day (BID) | ORAL | Status: DC

## 2015-06-02 ENCOUNTER — Telehealth: Payer: Self-pay | Admitting: *Deleted

## 2015-06-07 NOTE — Telephone Encounter (Signed)
[facility], stop taking the Septra take the Macrobid only due to urine culture resistant to Septra. Pt verbalized understanding.

## 2015-08-11 ENCOUNTER — Other Ambulatory Visit: Payer: Medicaid Other | Admitting: Advanced Practice Midwife

## 2015-08-24 ENCOUNTER — Other Ambulatory Visit: Payer: Medicaid Other | Admitting: Advanced Practice Midwife

## 2018-04-15 ENCOUNTER — Ambulatory Visit: Payer: Self-pay | Admitting: Adult Health

## 2018-04-16 ENCOUNTER — Other Ambulatory Visit: Payer: Self-pay

## 2018-04-16 ENCOUNTER — Encounter: Payer: Self-pay | Admitting: Adult Health

## 2018-04-16 ENCOUNTER — Ambulatory Visit: Payer: Medicaid Other | Admitting: Adult Health

## 2018-04-16 VITALS — BP 108/66 | HR 101 | Ht 66.0 in | Wt 352.0 lb

## 2018-04-16 DIAGNOSIS — Z3202 Encounter for pregnancy test, result negative: Secondary | ICD-10-CM | POA: Diagnosis not present

## 2018-04-16 DIAGNOSIS — N926 Irregular menstruation, unspecified: Secondary | ICD-10-CM | POA: Diagnosis not present

## 2018-04-16 LAB — POCT URINE PREGNANCY: Preg Test, Ur: NEGATIVE

## 2018-04-16 NOTE — Progress Notes (Signed)
  Subjective:     Patient ID: Kristen Kline, female   DOB: [birth year], 32 y.o.   MRN: [id number]  HPI Kristen Kline is a 32 year old black female, recently married in complaining of bleeding for almost 3 weeks, and some cramps.No bleeding now. PCP is Dr Felecia Shelling.   Review of Systems Period started 10/17 and lasted almost 3 weeks, no bleeding now Some cramps Reviewed past medical,surgical, social and family history. Reviewed medications and allergies.     Objective:   Physical Exam BP 108/66 (BP Location: Right Arm, Patient Position: Sitting, Cuff Size: Large)   Pulse (!) 101   Ht 5\' 6"  (1.676 m)   Wt (!) 352 lb (159.7 kg)   LMP 03/27/2018   BMI 56.81 kg/m UPT negative. Skin warm and dry. Neck: mid line trachea, normal thyroid, good ROM, no lymphadenopathy noted. Lungs: clear to ausculation bilaterally. Cardiovascular: regular rate and rhythm.Abdomen is soft and non tender,obese. Pelvic is deferred, and she declines STD testing.  Discussed she may not have ovulated on time, will follow for now, and she would like to get pregnant.  PHQ 2 score 0.    Assessment:     1. Irregular bleeding   2. Negative pregnancy test       Plan:     Return in 4 weeks for pap and physical  Keep calendar of periods

## 2018-05-15 ENCOUNTER — Emergency Department (HOSPITAL_BASED_OUTPATIENT_CLINIC_OR_DEPARTMENT_OTHER)
Admission: EM | Admit: 2018-05-15 | Discharge: 2018-05-16 | Disposition: A | Discharge status: Home / Self Care | Payer: Medicaid Other | Attending: Emergency Medicine | Admitting: Emergency Medicine

## 2018-05-15 ENCOUNTER — Encounter (HOSPITAL_BASED_OUTPATIENT_CLINIC_OR_DEPARTMENT_OTHER): Payer: Self-pay | Admitting: *Deleted

## 2018-05-15 ENCOUNTER — Other Ambulatory Visit: Payer: Medicaid Other | Admitting: Adult Health

## 2018-05-15 ENCOUNTER — Other Ambulatory Visit: Payer: Self-pay

## 2018-05-15 ENCOUNTER — Emergency Department (HOSPITAL_BASED_OUTPATIENT_CLINIC_OR_DEPARTMENT_OTHER): Payer: Medicaid Other

## 2018-05-15 DIAGNOSIS — S83412A Sprain of medial collateral ligament of left knee, initial encounter: Secondary | ICD-10-CM | POA: Insufficient documentation

## 2018-05-15 DIAGNOSIS — Z87891 Personal history of nicotine dependence: Secondary | ICD-10-CM | POA: Insufficient documentation

## 2018-05-15 DIAGNOSIS — E669 Obesity, unspecified: Secondary | ICD-10-CM | POA: Diagnosis not present

## 2018-05-15 DIAGNOSIS — W010XXA Fall on same level from slipping, tripping and stumbling without subsequent striking against object, initial encounter: Secondary | ICD-10-CM | POA: Diagnosis not present

## 2018-05-15 DIAGNOSIS — Y998 Other external cause status: Secondary | ICD-10-CM | POA: Diagnosis not present

## 2018-05-15 DIAGNOSIS — S8992XA Unspecified injury of left lower leg, initial encounter: Secondary | ICD-10-CM | POA: Diagnosis not present

## 2018-05-15 DIAGNOSIS — Y9301 Activity, walking, marching and hiking: Secondary | ICD-10-CM | POA: Diagnosis not present

## 2018-05-15 DIAGNOSIS — Y9289 Other specified places as the place of occurrence of the external cause: Secondary | ICD-10-CM | POA: Diagnosis not present

## 2018-05-15 DIAGNOSIS — M25562 Pain in left knee: Secondary | ICD-10-CM | POA: Diagnosis not present

## 2018-05-15 NOTE — ED Triage Notes (Signed)
Pt c/o fall x 1 week ago injuring left knee

## 2018-05-16 DIAGNOSIS — M25562 Pain in left knee: Secondary | ICD-10-CM | POA: Diagnosis not present

## 2018-05-16 DIAGNOSIS — S8992XA Unspecified injury of left lower leg, initial encounter: Secondary | ICD-10-CM | POA: Diagnosis not present

## 2018-05-16 MED ORDER — HYDROCODONE-ACETAMINOPHEN 5-325 MG PO TABS: 1.0000 | ORAL_TABLET | Freq: Four times a day (QID) | ORAL | 0 refills | Status: DC | PRN

## 2018-05-16 MED ORDER — IBUPROFEN 600 MG PO TABS: 600.0000 mg | ORAL_TABLET | Freq: Three times a day (TID) | ORAL | 0 refills | Status: DC | PRN

## 2018-05-16 NOTE — ED Notes (Signed)
Patient transported to X-ray 

## 2018-05-16 NOTE — ED Provider Notes (Signed)
[facility] EMERGENCY DEPARTMENT Provider Note   CSN: [id number] Arrival date & time: 05/15/18  2329     History   Chief Complaint Chief Complaint  Patient presents with  . Knee Injury    HPI Kristen Kline is a 32 y.o. female.  HPI 32 year old female with past medical history as below here with knee pain.  The patient states that approximately 6 days ago, she was walking.  She tripped, and her left knee bent inwards.  She reports landing on her knee.  Since then, she is had persistent aching, throbbing, left knee pain.  She feels like her knee is unstable.  She does have history of ligament tear that reportedly needed surgery but she did not follow-up.  She has not had any significant swelling.  No fevers or chills.  No distal numbness or weakness.  No other injuries.  Pain is worse with weightbearing.  No alleviating factors.  Past Medical History:  Diagnosis Date  . Anxiety     Patient Active Problem List   Diagnosis Date Noted  . UTI (lower urinary tract infection) 05/30/2015  . Morbid obesity with BMI of 50.0-59.9, adult (HCC) 05/30/2015    Past Surgical History:  Procedure Laterality Date  . CESAREAN SECTION    . CHOLECYSTECTOMY    . WISDOM TOOTH EXTRACTION       OB History    Gravida  2   Para  2   Term  2   Preterm      AB      Living  2     SAB      TAB      Ectopic      Multiple      Live Births               Home Medications    Prior to Admission medications   Medication Sig Start Date End Date Taking? Authorizing Provider  HYDROcodone-acetaminophen (NORCO/VICODIN) 5-325 MG tablet Take 1-2 tablets by mouth every 6 (six) hours as needed for severe pain. 05/16/18   Shaune Pollack, MD  ibuprofen (ADVIL,MOTRIN) 600 MG tablet Take 1 tablet (600 mg total) by mouth every 8 (eight) hours as needed for moderate pain. 05/16/18   Shaune Pollack, MD  Multiple Vitamin (MULTIVITAMIN) tablet Take 1 tablet by mouth daily.     [provider]    Family History Family History  Problem Relation Age of Onset  . Heart failure Mother   . Diabetes Mother   . Hypertension Father   . Stroke Paternal Grandfather   . Stroke Paternal Grandmother     Social History Social History   Tobacco Use  . Smoking status: Former Games developer  . Smokeless tobacco: Never Used  Substance Use Topics  . Alcohol use: No  . Drug use: No     Allergies   Patient has no known allergies.   Review of Systems Review of Systems  Constitutional: Negative for chills and fever.  Respiratory: Negative for shortness of breath.   Cardiovascular: Negative for chest pain.  Musculoskeletal: Positive for arthralgias and gait problem. Negative for neck pain.  Skin: Negative for rash and wound.  Allergic/Immunologic: Negative for immunocompromised state.  Neurological: Negative for weakness and numbness.  Hematological: Does not bruise/bleed easily.     Physical Exam Updated Vital Signs BP (!) 127/94 (BP Location: Right Wrist)   Pulse (!) 101   Temp 98.5 F (36.9 C) (Oral)   Resp 18   Ht  5\' 6"  (1.676 m)   Wt (!) 159 kg   LMP 05/14/2018   SpO2 100%   BMI 56.58 kg/m   Physical Exam  Constitutional: She is oriented to person, place, and time. She appears well-developed and well-nourished. No distress.  HENT:  Head: Normocephalic and atraumatic.  Eyes: Conjunctivae are normal.  Neck: Neck supple.  Cardiovascular: Normal rate, regular rhythm and normal heart sounds.  Pulmonary/Chest: Effort normal. No respiratory distress. She has no wheezes.  Abdominal: She exhibits no distension.  Musculoskeletal: She exhibits no edema.  Neurological: She is alert and oriented to person, place, and time. She exhibits normal muscle tone.  Skin: Skin is warm. Capillary refill takes less than 2 seconds. No rash noted.  Nursing note and vitals reviewed.   LOWER EXTREMITY EXAM: LEFT  INSPECTION & PALPATION: Tenderness to palpation  over the medial aspect of the knee.  No bruising or deformity.  There does appear to be some ligamentous laxity on stressing of the knee on valgus stress testing.  No significant anterior or posterior mobility on anterior posterior drawer sign.  Negative McMurray's/.  SENSORY: sensation is intact to light touch in:  Superficial peroneal nerve distribution (over dorsum of foot) Deep peroneal nerve distribution (over first dorsal web space) Sural nerve distribution (over lateral aspect 5th metatarsal) Saphenous nerve distribution (over medial instep)  MOTOR:  + Motor EHL (great toe dorsiflexion) + FHL (great toe plantar flexion)  + TA (ankle dorsiflexion)  + GSC (ankle plantar flexion)  VASCULAR: 2+ dorsalis pedis and posterior tibialis pulses Capillary refill < 2 sec, toes warm and well-perfused  COMPARTMENTS: Soft, warm, well-perfused No pain with passive extension No parethesias     ED Treatments / Results  Labs (all labs ordered are listed, but only abnormal results are displayed) Labs Reviewed - No data to display  EKG None  Radiology Dg Knee Complete 4 Views Left  Result Date: 05/16/2018 CLINICAL DATA:  32 year old female with fall and left knee pain. EXAM: LEFT KNEE - COMPLETE 4+ VIEW COMPARISON:  Left knee radiograph dated 05/29/2010 FINDINGS: There is no acute fracture or dislocation. Mild juxta-articular bone spurring, advanced for patient's age. The bones are well mineralized. No joint effusion. The soft tissues are grossly unremarkable. IMPRESSION: No acute fracture or dislocation. Electronically Signed   By: Elgie Collard M.D.   On: 05/16/2018 00:36    Procedures Procedures (including critical care time)  Medications Ordered in ED Medications - No data to display   Initial Impression / Assessment and Plan / ED Course  I have reviewed the triage vital signs and the nursing notes.  Pertinent labs & imaging results that were available during my care of the  patient were reviewed by me and considered in my medical decision making (see chart for details).     32 year old female here with suspected left MCL injury.  No evidence of concomitant meniscal injury clinically.  No swelling or signs to suggest septic or inflammatory arthritis.  Plain films negative.  Will give the patient crutches.  Attempted knee immobilizer but due to obesity, this is unable to be placed.  Will have her nonweightbearing, placed in a knee sleeve, and follow-up with orthopedics.  Return precautions given.  Final Clinical Impressions(s) / ED Diagnoses   Final diagnoses:  Sprain of medial collateral ligament of left knee, initial encounter    ED Discharge Orders         Ordered    ibuprofen (ADVIL,MOTRIN) 600 MG tablet  Every 8  hours PRN     05/16/18 0111    HYDROcodone-acetaminophen (NORCO/VICODIN) 5-325 MG tablet  Every 6 hours PRN     05/16/18 0111           Shaune PollackIsaacs, Brahm Barbeau, MD 05/16/18 [phone]

## 2018-05-16 NOTE — Discharge Instructions (Addendum)
For now, I recommend keeping the knee wrapped and use the crutches to minimize the amount of weight placed on it.  No heavy lifting or physical activity.  Call the office above, to set up an appointment within the next week.  You may ultimately need rehab and a knee brace.

## 2018-07-22 ENCOUNTER — Encounter (HOSPITAL_BASED_OUTPATIENT_CLINIC_OR_DEPARTMENT_OTHER): Payer: Self-pay | Admitting: Emergency Medicine

## 2018-07-22 ENCOUNTER — Other Ambulatory Visit: Payer: Self-pay

## 2018-07-22 ENCOUNTER — Emergency Department (HOSPITAL_BASED_OUTPATIENT_CLINIC_OR_DEPARTMENT_OTHER)
Admission: EM | Admit: 2018-07-22 | Discharge: 2018-07-22 | Disposition: A | Discharge status: Home / Self Care | Payer: Medicaid Other | Attending: Emergency Medicine | Admitting: Emergency Medicine

## 2018-07-22 DIAGNOSIS — R509 Fever, unspecified: Secondary | ICD-10-CM | POA: Diagnosis not present

## 2018-07-22 DIAGNOSIS — Z79899 Other long term (current) drug therapy: Secondary | ICD-10-CM | POA: Insufficient documentation

## 2018-07-22 DIAGNOSIS — N3001 Acute cystitis with hematuria: Secondary | ICD-10-CM | POA: Diagnosis not present

## 2018-07-22 DIAGNOSIS — J111 Influenza due to unidentified influenza virus with other respiratory manifestations: Secondary | ICD-10-CM | POA: Insufficient documentation

## 2018-07-22 DIAGNOSIS — R5383 Other fatigue: Secondary | ICD-10-CM | POA: Diagnosis not present

## 2018-07-22 DIAGNOSIS — R69 Illness, unspecified: Secondary | ICD-10-CM

## 2018-07-22 DIAGNOSIS — Z87891 Personal history of nicotine dependence: Secondary | ICD-10-CM | POA: Diagnosis not present

## 2018-07-22 DIAGNOSIS — R05 Cough: Secondary | ICD-10-CM | POA: Diagnosis present

## 2018-07-22 HISTORY — DX: Acquired absence of other specified parts of digestive tract: Z90.49

## 2018-07-22 LAB — URINALYSIS, ROUTINE W REFLEX MICROSCOPIC
BILIRUBIN URINE: NEGATIVE
Glucose, UA: NEGATIVE mg/dL
Ketones, ur: NEGATIVE mg/dL
Nitrite: NEGATIVE
Protein, ur: NEGATIVE mg/dL
SPECIFIC GRAVITY, URINE: 1.02 (ref 1.005–1.030)
pH: 6 (ref 5.0–8.0)

## 2018-07-22 LAB — PREGNANCY, URINE: Preg Test, Ur: NEGATIVE

## 2018-07-22 LAB — URINALYSIS, MICROSCOPIC (REFLEX)

## 2018-07-22 MED ORDER — CEPHALEXIN 500 MG PO CAPS: 500.0000 mg | ORAL_CAPSULE | Freq: Three times a day (TID) | ORAL | 0 refills | Status: DC

## 2018-07-22 MED ORDER — IBUPROFEN 400 MG PO TABS
400.0000 mg | ORAL_TABLET | Freq: Once | ORAL | Status: AC
  Administered 2018-07-22: 400 mg via ORAL
  Filled 2018-07-22: qty 1

## 2018-07-22 MED ORDER — CEPHALEXIN 250 MG PO CAPS
500.0000 mg | ORAL_CAPSULE | Freq: Once | ORAL | Status: AC
  Administered 2018-07-22: 500 mg via ORAL
  Filled 2018-07-22: qty 2

## 2018-07-22 NOTE — ED Provider Notes (Signed)
[facility] EMERGENCY DEPARTMENT Provider Note   CSN: [id number] Arrival date & time: 07/22/18  0128     History   Chief Complaint Chief Complaint  Patient presents with  . Cough    HPI Kristen Kline is a 33 y.o. female.  The history is provided by the patient.  Cough  Severity:  Moderate Onset quality:  Gradual Timing:  Intermittent Progression:  Worsening Chronicity:  New Smoker: no   Relieved by:  None tried Worsened by:  Nothing Associated symptoms: chills, diaphoresis, fever and myalgias   Patient with history of anxiety presents with flulike illness.  She reports approximately 2 days she has had chills, cough.  No vomiting or diarrhea.  No travel. She reports this feels similar to prior episodes of UTI.   Past Medical History:  Diagnosis Date  . Anxiety   . Hx of cholecystectomy     Patient Active Problem List   Diagnosis Date Noted  . UTI (lower urinary tract infection) 05/30/2015  . Morbid obesity with BMI of 50.0-59.9, adult (HCC) 05/30/2015    Past Surgical History:  Procedure Laterality Date  . CESAREAN SECTION    . CHOLECYSTECTOMY    . WISDOM TOOTH EXTRACTION       OB History    Gravida  2   Para  2   Term  2   Preterm      AB      Living  2     SAB      TAB      Ectopic      Multiple      Live Births               Home Medications    Prior to Admission medications   Medication Sig Start Date End Date Taking? Authorizing Provider  cephALEXin (KEFLEX) 500 MG capsule Take 1 capsule (500 mg total) by mouth 3 (three) times daily. 07/22/18   Zadie RhineWickline, Pau Banh, MD  Multiple Vitamin (MULTIVITAMIN) tablet Take 1 tablet by mouth daily.    [provider]    Family History Family History  Problem Relation Age of Onset  . Heart failure Mother   . Diabetes Mother   . Hypertension Father   . Stroke Paternal Grandfather   . Stroke Paternal Grandmother     Social History Social History    Tobacco Use  . Smoking status: Former Games developermoker  . Smokeless tobacco: Never Used  Substance Use Topics  . Alcohol use: No  . Drug use: No     Allergies   Patient has no known allergies.   Review of Systems Review of Systems  Constitutional: Positive for chills, diaphoresis, fatigue and fever.  Respiratory: Positive for cough.   Gastrointestinal: Negative for vomiting.  Genitourinary: Negative for dysuria.  Musculoskeletal: Positive for myalgias.  All other systems reviewed and are negative.    Physical Exam Updated Vital Signs BP (!) 148/81 (BP Location: Right Arm)   Pulse 100   Temp 99.4 F (37.4 C) (Oral)   Resp 20   Ht 1.676 m (5\' 6" )   Wt 113.4 kg   SpO2 100%   BMI 40.35 kg/m   Physical Exam  CONSTITUTIONAL: Well developed/well nourished HEAD: Normocephalic/atraumatic EYES: EOMI/PERRL ENMT: Mucous membranes moist NECK: supple no meningeal signs SPINE/BACK:entire spine nontender CV: S1/S2 noted, no murmurs/rubs/gallops noted LUNGS: Lungs are clear to auscultation bilaterally, no apparent distress ABDOMEN: soft, nontender, no rebound or guarding, bowel sounds noted throughout abdomen GU:no cva  tenderness NEURO: Pt is awake/alert/appropriate, moves all extremitiesx4.  No facial droop.   EXTREMITIES: pulses normal/equal, full ROM SKIN: warm, color normal PSYCH: no abnormalities of mood noted, alert and oriented to situation  ED Treatments / Results  Labs (all labs ordered are listed, but only abnormal results are displayed) Labs Reviewed  URINALYSIS, ROUTINE W REFLEX MICROSCOPIC - Abnormal; Notable for the following components:      Result Value   APPearance CLOUDY (*)    Hgb urine dipstick LARGE (*)    Leukocytes, UA SMALL (*)    All other components within normal limits  URINALYSIS, MICROSCOPIC (REFLEX) - Abnormal; Notable for the following components:   Bacteria, UA MANY (*)    All other components within normal limits  PREGNANCY, URINE     EKG None  Radiology No results found.  Procedures Procedures Medications Ordered in ED Medications  ibuprofen (ADVIL,MOTRIN) tablet 400 mg (400 mg Oral Given 07/22/18 0428)  cephALEXin (KEFLEX) capsule 500 mg (500 mg Oral Given 07/22/18 0428)     Initial Impression / Assessment and Plan / ED Course  I have reviewed the triage vital signs and the nursing notes.  Pertinent labs  results that were available during my care of the patient were reviewed by me and considered in my medical decision making (see chart for details).     Patient presents for flulike symptoms.  She also reports feeling several episodes of UTI.  Urinalysis does have evidence of bacteria, will start on Keflex.  However, I do feel she may have influenza given cough, myalgias and diaphoresis. She is in no acute distress at this time, no hypoxia.  She is awake, smiling and watching TV. Defer further testing this time.  Patient is well-appearing and appropriate for discharge  Final Clinical Impressions(s) / ED Diagnoses   Final diagnoses:  Influenza-like illness  Acute cystitis with hematuria    ED Discharge Orders         Ordered    cephALEXin (KEFLEX) 500 MG capsule  3 times daily     07/22/18 [id number]           Zadie RhineWickline, Ecko Beasley, MD 07/22/18 [phone]

## 2018-07-22 NOTE — ED Triage Notes (Signed)
PT also states may have a uti, but no urinary sx and is on her menstrual cycle.

## 2018-07-22 NOTE — ED Triage Notes (Signed)
Body aches, chills, runny nose since last night. Last motrin was at 2100

## 2018-07-22 NOTE — ED Notes (Signed)
Ice water provided.

## 2018-07-22 NOTE — ED Notes (Signed)
ED Provider at bedside. 

## 2019-03-13 ENCOUNTER — Emergency Department (HOSPITAL_BASED_OUTPATIENT_CLINIC_OR_DEPARTMENT_OTHER)
Admission: EM | Admit: 2019-03-13 | Discharge: 2019-03-13 | Disposition: A | Discharge status: Home / Self Care | Payer: Medicaid Other | Attending: Emergency Medicine | Admitting: Emergency Medicine

## 2019-03-13 ENCOUNTER — Other Ambulatory Visit: Payer: Self-pay

## 2019-03-13 ENCOUNTER — Encounter (HOSPITAL_BASED_OUTPATIENT_CLINIC_OR_DEPARTMENT_OTHER): Payer: Self-pay | Admitting: Emergency Medicine

## 2019-03-13 DIAGNOSIS — Z87891 Personal history of nicotine dependence: Secondary | ICD-10-CM | POA: Diagnosis not present

## 2019-03-13 DIAGNOSIS — Z79899 Other long term (current) drug therapy: Secondary | ICD-10-CM | POA: Diagnosis not present

## 2019-03-13 DIAGNOSIS — K0889 Other specified disorders of teeth and supporting structures: Secondary | ICD-10-CM | POA: Diagnosis not present

## 2019-03-13 MED ORDER — PENICILLIN V POTASSIUM 500 MG PO TABS: 500.0000 mg | ORAL_TABLET | Freq: Four times a day (QID) | ORAL | 0 refills | Status: AC

## 2019-03-13 MED ORDER — IBUPROFEN 600 MG PO TABS: 600.0000 mg | ORAL_TABLET | Freq: Four times a day (QID) | ORAL | 0 refills | Status: DC | PRN

## 2019-03-13 MED FILL — PENICILLIN VK 500 MG TABLET: 500 | 7 days supply | Qty: 28 | Fill #0

## 2019-03-13 MED FILL — IBUPROFEN 600 MG TABLET: 600 | 7 days supply | Qty: 30 | Fill #0

## 2019-03-13 NOTE — Discharge Instructions (Signed)
Please take entire course of antibiotics as directed.  Continue using ibuprofen and Tylenol, as well as topical Orajel for pain.  You will need to follow-up with your dentist for continued management of this.  Return to the emergency department for fevers, swelling or pain under the tongue or in the neck, difficulty breathing or swallowing or any other new or concerning symptoms.

## 2019-03-13 NOTE — ED Triage Notes (Addendum)
Dental pain in bottom right and upper left sides of mouth.  Pt received ABX for this a couple months ago but has not been able to get an appt with oral surgeon.  Her dentist is trying to help.  Started hurting again yesterday.  Pt took Four OTC Ibuprofen 2 hrs ago so it feels just a little better than it did.

## 2019-03-13 NOTE — ED Provider Notes (Signed)
[facility] EMERGENCY DEPARTMENT Provider Note   CSN: [id number] Arrival date & time: 03/13/19  1408     History   Chief Complaint Chief Complaint  Patient presents with  . Dental Pain    HPI Kristen Kline is a 33 y.o. female.     Kristen Kline is a 33 y.o. female with a history of obesity, anxiety, and previous cholecystectomy, who presents to the ED for evaluation of dental pain.  Reports that today she noticed pain over her right lower molars and left upper molars.  She has had issues with dental pain in both of these areas previously, a few months ago was placed on antibiotics with improvement.  She has been following up with her dentist, ultimately reports that she needs to see an oral surgeon, but due to insurance issues this has been delayed.  She denies any associated fevers, no vomiting.  She denies any pain or swelling beneath the tongue she has noticed some slight swelling if anything over the right lower jaw but no significant facial swelling.  No difficulty swallowing does report some pain with chewing.  No neck pain or swelling.  No other aggravating or alleviating factors.  She is unsure which antibiotic she was placed on previously.  She thinks it may have been penicillin.     Past Medical History:  Diagnosis Date  . Anxiety   . Hx of cholecystectomy   . Obesity     Patient Active Problem List   Diagnosis Date Noted  . UTI (lower urinary tract infection) 05/30/2015  . Morbid obesity with BMI of 50.0-59.9, adult (HCC) 05/30/2015    Past Surgical History:  Procedure Laterality Date  . CESAREAN SECTION    . CHOLECYSTECTOMY    . WISDOM TOOTH EXTRACTION       OB History    Gravida  2   Para  2   Term  2   Preterm      AB      Living  2     SAB      TAB      Ectopic      Multiple      Live Births               Home Medications    Prior to Admission medications   Medication Sig Start Date End Date Taking?  Authorizing Provider  cephALEXin (KEFLEX) 500 MG capsule Take 1 capsule (500 mg total) by mouth 3 (three) times daily. 07/22/18   Zadie Rhine, MD  ibuprofen (ADVIL) 600 MG tablet Take 1 tablet (600 mg total) by mouth every 6 (six) hours as needed. 03/13/19   Dartha Lodge, PA-C  Multiple Vitamin (MULTIVITAMIN) tablet Take 1 tablet by mouth daily.    [provider]  penicillin v potassium (VEETID) 500 MG tablet Take 1 tablet (500 mg total) by mouth 4 (four) times daily for 7 days. 03/13/19 03/20/19  Dartha Lodge, PA-C    Family History Family History  Problem Relation Age of Onset  . Heart failure Mother   . Diabetes Mother   . Hypertension Father   . Stroke Paternal Grandfather   . Stroke Paternal Grandmother     Social History Social History   Tobacco Use  . Smoking status: Former Games developer  . Smokeless tobacco: Never Used  Substance Use Topics  . Alcohol use: No  . Drug use: No     Allergies   Patient has no known  allergies.   Review of Systems Review of Systems  Constitutional: Negative for chills and fever.  HENT: Positive for dental problem. Negative for facial swelling.   Respiratory: Negative for stridor.   Gastrointestinal: Negative for nausea and vomiting.  Musculoskeletal: Negative for neck pain and neck stiffness.     Physical Exam Updated Vital Signs BP 133/77 (BP Location: Right Arm)   Pulse 87   Temp 98.3 F (36.8 C) (Oral)   Resp 16   Ht 5\' 6"  (1.676 m)   Wt (!) 163.3 kg   LMP 03/02/2019   SpO2 100%   BMI 58.11 kg/m   Physical Exam Vitals signs and nursing note reviewed.  Constitutional:      General: She is not in acute distress.    Appearance: Normal appearance. She is well-developed. She is obese. She is not diaphoretic.  HENT:     Head: Normocephalic and atraumatic.     Mouth/Throat:     Comments: Pain over right lower and left upper molars, no obvious drainable abscess, no pain or swelling underneath the tongue, posterior  oropharynx is clear, tolerating secretions, normal phonation, no trismus.  No noted facial swelling. Eyes:     General:        Right eye: No discharge.        Left eye: No discharge.  Neck:     Musculoskeletal: Neck supple.     Comments: No swelling, tenderness or stridor. Pulmonary:     Effort: Pulmonary effort is normal. No respiratory distress.  Skin:    General: Skin is warm and dry.  Neurological:     Mental Status: She is alert.     Coordination: Coordination normal.  Psychiatric:        Mood and Affect: Mood normal.        Behavior: Behavior normal.      ED Treatments / Results  Labs (all labs ordered are listed, but only abnormal results are displayed) Labs Reviewed - No data to display  EKG None  Radiology No results found.  Procedures Procedures (including critical care time)  Medications Ordered in ED Medications - No data to display   Initial Impression / Assessment and Plan / ED Course  I have reviewed the triage vital signs and the nursing notes.  Pertinent labs & imaging results that were available during my care of the patient were reviewed by me and considered in my medical decision making (see chart for details).  Patient with toothache.  No gross abscess.  Exam unconcerning for Ludwig's angina or spread of infection.  Will treat with penicillin and anti-inflammatories medicine.  Urged patient to follow-up with dentist.    Final Clinical Impressions(s) / ED Diagnoses   Final diagnoses:  Pain, dental    ED Discharge Orders         Ordered    penicillin v potassium (VEETID) 500 MG tablet  4 times daily     03/13/19 1514    ibuprofen (ADVIL) 600 MG tablet  Every 6 hours PRN     03/13/19 1514           Jacqlyn Larsen, [place] 03/13/19 1518    Wyvonnia Dusky, MD 03/13/19 1921

## 2019-10-05 ENCOUNTER — Ambulatory Visit: Payer: Medicaid Other | Attending: Internal Medicine

## 2019-10-05 ENCOUNTER — Other Ambulatory Visit: Payer: Self-pay

## 2019-10-05 DIAGNOSIS — Z20822 Contact with and (suspected) exposure to covid-19: Secondary | ICD-10-CM

## 2019-10-06 LAB — SARS-COV-2, NAA 2 DAY TAT

## 2019-10-06 LAB — NOVEL CORONAVIRUS, NAA: SARS-CoV-2, NAA: DETECTED — AB

## 2019-10-07 ENCOUNTER — Telehealth: Payer: Self-pay | Admitting: Physician Assistant

## 2019-10-07 ENCOUNTER — Encounter: Payer: Self-pay | Admitting: Physician Assistant

## 2019-10-07 ENCOUNTER — Telehealth: Payer: Self-pay | Admitting: Adult Health

## 2019-10-07 NOTE — Telephone Encounter (Signed)
Called to discuss with patient about Covid symptoms and the use of bamlanivimab/etesevimab or casirivimab/imdevimab, a monoclonal antibody infusion for those with mild to moderate Covid symptoms and at a high risk of hospitalization.  Pt is qualified for this infusion at the [facility] infusion center due to BMI>35   Message left to call back and sent mychart message  Carmelia Tiner PA-C  [facility]    

## 2019-10-07 NOTE — Telephone Encounter (Signed)
Called and [facility] for patient to return our call to further discuss monoclonal antibody therapy for COVID 19.  Lillard Anes, NP

## 2019-10-08 ENCOUNTER — Telehealth (INDEPENDENT_AMBULATORY_CARE_PROVIDER_SITE_OTHER): Payer: Self-pay | Admitting: Adult Health

## 2019-10-08 NOTE — Telephone Encounter (Signed)
Called to discuss with patient about Covid symptoms and the use of bamlanivimab/etesevimab or casirivimab/imdevimab, a monoclonal antibody infusion for those with mild to moderate Covid symptoms and at a high risk of hospitalization.  Pt is qualified for this infusion at the [facility] infusion center due to BMI>35.  Message left to call back.  MyChart previously sent.  Nikeia Henkes. NP-C

## 2019-10-09 ENCOUNTER — Emergency Department (HOSPITAL_BASED_OUTPATIENT_CLINIC_OR_DEPARTMENT_OTHER): Payer: Medicaid Other

## 2019-10-09 ENCOUNTER — Encounter (HOSPITAL_BASED_OUTPATIENT_CLINIC_OR_DEPARTMENT_OTHER): Payer: Self-pay | Admitting: *Deleted

## 2019-10-09 ENCOUNTER — Inpatient Hospital Stay (HOSPITAL_BASED_OUTPATIENT_CLINIC_OR_DEPARTMENT_OTHER)
Admission: EM | Admit: 2019-10-09 | Discharge: 2019-10-13 | DRG: 177 | Disposition: A | Discharge status: Home / Self Care | Payer: Medicaid Other | Attending: Internal Medicine | Admitting: Internal Medicine

## 2019-10-09 ENCOUNTER — Other Ambulatory Visit: Payer: Self-pay

## 2019-10-09 DIAGNOSIS — R0602 Shortness of breath: Secondary | ICD-10-CM | POA: Diagnosis not present

## 2019-10-09 DIAGNOSIS — Z87891 Personal history of nicotine dependence: Secondary | ICD-10-CM | POA: Diagnosis not present

## 2019-10-09 DIAGNOSIS — J1282 Pneumonia due to coronavirus disease 2019: Secondary | ICD-10-CM | POA: Diagnosis not present

## 2019-10-09 DIAGNOSIS — Z79899 Other long term (current) drug therapy: Secondary | ICD-10-CM

## 2019-10-09 DIAGNOSIS — Z833 Family history of diabetes mellitus: Secondary | ICD-10-CM

## 2019-10-09 DIAGNOSIS — Z6841 Body Mass Index (BMI) 40.0 and over, adult: Secondary | ICD-10-CM

## 2019-10-09 DIAGNOSIS — D509 Iron deficiency anemia, unspecified: Secondary | ICD-10-CM

## 2019-10-09 DIAGNOSIS — Z823 Family history of stroke: Secondary | ICD-10-CM | POA: Diagnosis not present

## 2019-10-09 DIAGNOSIS — Z8249 Family history of ischemic heart disease and other diseases of the circulatory system: Secondary | ICD-10-CM | POA: Diagnosis not present

## 2019-10-09 DIAGNOSIS — Z9049 Acquired absence of other specified parts of digestive tract: Secondary | ICD-10-CM | POA: Diagnosis not present

## 2019-10-09 DIAGNOSIS — Z791 Long term (current) use of non-steroidal anti-inflammatories (NSAID): Secondary | ICD-10-CM

## 2019-10-09 DIAGNOSIS — J9601 Acute respiratory failure with hypoxia: Secondary | ICD-10-CM

## 2019-10-09 DIAGNOSIS — U071 COVID-19: Secondary | ICD-10-CM

## 2019-10-09 DIAGNOSIS — N920 Excessive and frequent menstruation with regular cycle: Secondary | ICD-10-CM | POA: Diagnosis present

## 2019-10-09 HISTORY — DX: COVID-19: U07.1

## 2019-10-09 LAB — COMPREHENSIVE METABOLIC PANEL
ALT: 32 U/L (ref 0–44)
AST: 70 U/L — ABNORMAL HIGH (ref 15–41)
Albumin: 3.5 g/dL (ref 3.5–5.0)
Alkaline Phosphatase: 57 U/L (ref 38–126)
Anion gap: 10 (ref 5–15)
BUN: 8 mg/dL (ref 6–20)
CO2: 22 mmol/L (ref 22–32)
Calcium: 8.3 mg/dL — ABNORMAL LOW (ref 8.9–10.3)
Chloride: 104 mmol/L (ref 98–111)
Creatinine, Ser: 0.81 mg/dL (ref 0.44–1.00)
GFR calc Af Amer: >60 mL/min (ref >60)
GFR calc non Af Amer: >60 mL/min (ref >60)
Glucose, Bld: 121 mg/dL — ABNORMAL HIGH (ref 70–99)
Potassium: 3.8 mmol/L (ref 3.5–5.1)
Sodium: 136 mmol/L (ref 135–145)
Total Bilirubin: 0.2 mg/dL — ABNORMAL LOW (ref 0.3–1.2)
Total Protein: 7.9 g/dL (ref 6.5–8.1)

## 2019-10-09 LAB — CBC WITH DIFFERENTIAL/PLATELET
Abs Immature Granulocytes: 0.02 10*3/uL (ref 0.00–0.07)
Basophils Absolute: 0 10*3/uL (ref 0.0–0.1)
Basophils Relative: 0 %
Eosinophils Absolute: 0 10*3/uL (ref 0.0–0.5)
Eosinophils Relative: 0 %
HCT: 31.2 % — ABNORMAL LOW (ref 36.0–46.0)
Hemoglobin: 9.5 g/dL — ABNORMAL LOW (ref 12.0–15.0)
Immature Granulocytes: 1 %
Lymphocytes Relative: 36 %
Lymphs Abs: 1.2 10*3/uL (ref 0.7–4.0)
MCH: 21.3 pg — ABNORMAL LOW (ref 26.0–34.0)
MCHC: 30.4 g/dL (ref 30.0–36.0)
MCV: 70.1 fL — ABNORMAL LOW (ref 80.0–100.0)
Monocytes Absolute: 0.2 10*3/uL (ref 0.1–1.0)
Monocytes Relative: 7 %
Neutro Abs: 1.9 10*3/uL (ref 1.7–7.7)
Neutrophils Relative %: 56 %
Platelets: 374 10*3/uL (ref 150–400)
RBC: 4.45 MIL/uL (ref 3.87–5.11)
RDW: 17.4 % — ABNORMAL HIGH (ref 11.5–15.5)
WBC: 3.3 10*3/uL — ABNORMAL LOW (ref 4.0–10.5)
nRBC: 0 % (ref 0.0–0.2)

## 2019-10-09 LAB — LACTIC ACID, PLASMA: Lactic Acid, Venous: 1.1 mmol/L (ref 0.5–1.9)

## 2019-10-09 LAB — HCG, QUANTITATIVE, PREGNANCY: hCG, Beta Chain, Quant, S: <1 m[IU]/mL (ref <5)

## 2019-10-09 LAB — TRIGLYCERIDES: Triglycerides: 105 mg/dL (ref <150)

## 2019-10-09 LAB — PROCALCITONIN: Procalcitonin: <0.1 ng/mL

## 2019-10-09 LAB — C-REACTIVE PROTEIN: CRP: 2.4 mg/dL — ABNORMAL HIGH (ref <1.0)

## 2019-10-09 LAB — FIBRINOGEN: Fibrinogen: 573 mg/dL — ABNORMAL HIGH (ref 210–475)

## 2019-10-09 LAB — FERRITIN: Ferritin: 95 ng/mL (ref 11–307)

## 2019-10-09 LAB — D-DIMER, QUANTITATIVE: D-Dimer, Quant: 0.56 ug/mL-FEU — ABNORMAL HIGH (ref 0.00–0.50)

## 2019-10-09 LAB — LACTATE DEHYDROGENASE: LDH: 322 U/L — ABNORMAL HIGH (ref 98–192)

## 2019-10-09 MED ORDER — SODIUM CHLORIDE 0.9 % IV SOLN
100.0000 mg | Freq: Every day | INTRAVENOUS | Status: AC
  Administered 2019-10-10 – 2019-10-13 (×4): 100 mg via INTRAVENOUS
  Filled 2019-10-09 (×5): qty 20

## 2019-10-09 MED ORDER — SODIUM CHLORIDE 0.9 % IV SOLN
100.0000 mg | INTRAVENOUS | Status: AC
  Administered 2019-10-09 (×2): 100 mg via INTRAVENOUS
  Filled 2019-10-09 (×3): qty 20

## 2019-10-09 MED ORDER — DEXAMETHASONE SODIUM PHOSPHATE 10 MG/ML IJ SOLN
6.0000 mg | Freq: Once | INTRAMUSCULAR | Status: AC
  Administered 2019-10-09: 6 mg via INTRAVENOUS
  Filled 2019-10-09: qty 1

## 2019-10-09 MED ORDER — ACETAMINOPHEN 500 MG PO TABS
1000.0000 mg | ORAL_TABLET | Freq: Once | ORAL | Status: AC
  Administered 2019-10-09: 1000 mg via ORAL
  Filled 2019-10-09: qty 2

## 2019-10-09 MED ORDER — SODIUM CHLORIDE 0.9 % IV SOLN
1000.0000 mL | INTRAVENOUS | Status: DC
  Administered 2019-10-09: 1000 mL via INTRAVENOUS
  Administered 2019-10-10: 500 mL via INTRAVENOUS

## 2019-10-09 NOTE — ED Notes (Signed)
Pt husband noted

## 2019-10-09 NOTE — ED Notes (Signed)
Pt on monitor 

## 2019-10-09 NOTE — ED Provider Notes (Signed)
[facility] EMERGENCY DEPARTMENT Provider Note   CSN: [id number] Arrival date & time: 10/09/19  1216     History No chief complaint on file.   Kristen Kline is a 34 y.o. female.  HPI Patient had Covid exposure 6 days ago.  Her husband developed symptoms 2 days before she became ill.  She had body aches, fever to 102 and diarrhea.  Over the past 2 days she has had increasing cough and today was short of breath.  She reports shortness of breath seems to worsen.  She has no vomiting.  She has had decreased oral intake.  She does report significant fatigue and general weakness.    Past Medical History:  Diagnosis Date  . Anxiety   . Hx of cholecystectomy   . Morbid obesity [facility])     Patient Active Problem List   Diagnosis Date Noted  . Pneumonia due to COVID-19 virus 10/09/2019  . Morbid obesity ([place])   . UTI (lower urinary tract infection) 05/30/2015  . Morbid obesity with BMI of 50.0-59.9, adult ([place]) 05/30/2015    Past Surgical History:  Procedure Laterality Date  . CESAREAN SECTION    . CHOLECYSTECTOMY    . WISDOM TOOTH EXTRACTION       OB History    Gravida  2   Para  2   Term  2   Preterm      AB      Living  2     SAB      TAB      Ectopic      Multiple      Live Births              Family History  Problem Relation Age of Onset  . Heart failure Mother   . Diabetes Mother   . Hypertension Father   . Stroke Paternal Grandfather   . Stroke Paternal Grandmother     Social History   Tobacco Use  . Smoking status: Former Research scientist (life sciences)  . Smokeless tobacco: Never Used  Substance Use Topics  . Alcohol use: No  . Drug use: No    Home Medications Prior to Admission medications   Medication Sig Start Date End Date Taking? Authorizing Provider  ibuprofen (ADVIL) 600 MG tablet Take 1 tablet (600 mg total) by mouth every 6 (six) hours as needed. 03/13/19  Yes Jacqlyn Larsen, PA-C  Multiple Vitamin (MULTIVITAMIN) tablet Take 1  tablet by mouth daily.   Yes [provider]  cephALEXin (KEFLEX) 500 MG capsule Take 1 capsule (500 mg total) by mouth 3 (three) times daily. 07/22/18   Ripley Fraise, MD    Allergies    Patient has no known allergies.  Review of Systems   Review of Systems 10 Systems reviewed and are negative for acute change except as noted in the HPI.  Physical Exam Updated Vital Signs BP 105/73 (BP Location: Right Arm)   Pulse (!) 111   Temp 100 F (37.8 C) (Oral)   Resp 12   Ht 5\' 6"  (1.676 m)   Wt (!) 163.3 kg   LMP 10/09/2019   SpO2 94%   BMI 58.11 kg/m   Physical Exam Constitutional:      Comments: Alert, nontoxic.  Mild increased work of breathing.  Morbid obesity.  HENT:     Head: Normocephalic and atraumatic.     Mouth/Throat:     Mouth: Mucous membranes are moist.     Pharynx: Oropharynx is clear.  Eyes:     Extraocular Movements: Extraocular movements intact.  Cardiovascular:     Comments: Tachycardia, no rub murmur gallop Pulmonary:     Comments: Mild increased work of breathing with tachypnea.  Oxygen saturation at rest 90 to 93%.  Faint crackles at the bases. Abdominal:     General: There is no distension.     Palpations: Abdomen is soft.     Tenderness: There is no abdominal tenderness. There is no guarding.  Musculoskeletal:        General: No swelling or tenderness. Normal range of motion.     Cervical back: Neck supple.     Right lower leg: No edema.     Left lower leg: No edema.  Skin:    General: Skin is warm and dry.  Neurological:     General: No focal deficit present.     Mental Status: She is oriented to person, place, and time.     Coordination: Coordination normal.  Psychiatric:        Mood and Affect: Mood normal.     ED Results / Procedures / Treatments   Labs (all labs ordered are listed, but only abnormal results are displayed) Labs Reviewed  CBC WITH DIFFERENTIAL/PLATELET - Abnormal; Notable for the following components:       Result Value   WBC 3.3 (*)    Hemoglobin 9.5 (*)    HCT 31.2 (*)    MCV 70.1 (*)    MCH 21.3 (*)    RDW 17.4 (*)    All other components within normal limits  COMPREHENSIVE METABOLIC PANEL - Abnormal; Notable for the following components:   Glucose, Bld 121 (*)    Calcium 8.3 (*)    AST 70 (*)    Total Bilirubin 0.2 (*)    All other components within normal limits  D-DIMER, QUANTITATIVE (NOT AT [facility]) - Abnormal; Notable for the following components:   D-Dimer, Quant 0.56 (*)    All other components within normal limits  CULTURE, BLOOD (ROUTINE X 2)  CULTURE, BLOOD (ROUTINE X 2)  LACTIC ACID, PLASMA  HCG, QUANTITATIVE, PREGNANCY  LACTIC ACID, PLASMA  PROCALCITONIN  LACTATE DEHYDROGENASE  FERRITIN  TRIGLYCERIDES  FIBRINOGEN  C-REACTIVE PROTEIN    EKG None  Radiology DG Chest Port 1 View  Result Date: 10/09/2019 CLINICAL DATA:  Shortness of breath.  COVID-19 positive. EXAM: PORTABLE CHEST 1 VIEW COMPARISON:  05/29/2010 FINDINGS: Lungs are hypoinflated with hazy opacification over the right perihilar region and right lung base which could be seen due to asymmetric edema versus infection. No evidence of effusion. Cardiomediastinal silhouette and remainder of the exam is unchanged. IMPRESSION: Hazy opacification over the right perihilar region and right base which may be due to asymmetric vascular congestion versus infection. Electronically Signed   By: Elberta Fortis M.D.   On: 10/09/2019 13:29    Procedures Procedures (including critical care time)  Medications Ordered in ED Medications  0.9 %  sodium chloride infusion (1,000 mLs Intravenous New Bag/Given 10/09/19 1349)  remdesivir 100 mg in sodium chloride 0.9 % 100 mL IVPB (100 mg Intravenous New Bag/Given 10/09/19 1527)  remdesivir 100 mg in sodium chloride 0.9 % 100 mL IVPB (has no administration in time range)  acetaminophen (TYLENOL) tablet 1,000 mg (1,000 mg Oral Given 10/09/19 1428)  dexamethasone (DECADRON) injection  6 mg (6 mg Intravenous Given 10/09/19 1428)    ED Course  I have reviewed the triage vital signs and the nursing notes.  Pertinent labs &  imaging results that were available during my care of the patient were reviewed by me and considered in my medical decision making (see chart for details).    MDM Rules/Calculators/A&P                      Consult:Dr. Pokhrel at [place] long for admission.  Patient presents with Covid exposure and positive testing.  She has findings of pneumonia on chest x-ray.  Patient has tachypnea and oxygen saturation low 90s.  She is at approximately day 6 of symptoms.  Patient appears high risk for complications of Covid pneumonia with morbid obesity and respiratory symptoms with pneumonia in the first week of illness.  Will treat with remdesivir and Decadron and admit for further treatment.  Kristen Kline was evaluated in Emergency Department on 10/09/2019 for the symptoms described in the history of present illness. She was evaluated in the context of the global COVID-19 pandemic, which necessitated consideration that the patient might be at risk for infection with the SARS-CoV-2 virus that causes COVID-19. Institutional protocols and algorithms that pertain to the evaluation of patients at risk for COVID-19 are in a state of rapid change based on information released by regulatory bodies including the CDC and federal and state organizations. These policies and algorithms were followed during the patient's care in the ED. Final Clinical Impression(s) / ED Diagnoses Final diagnoses:  Pneumonia due to COVID-19 virus    Rx / DC Orders ED Discharge Orders    None       Arby Barrette, MD 10/09/19 1538

## 2019-10-09 NOTE — ED Notes (Signed)
IV in right ac flushes well with good blood return.  Restarted IV fluid per order.

## 2019-10-09 NOTE — ED Notes (Signed)
Pt provided with BSC.

## 2019-10-09 NOTE — ED Triage Notes (Signed)
Covid positive. Sob.

## 2019-10-09 NOTE — ED Notes (Signed)
Helped pt to commode multiple times, found left AC  IV almost out of her arm, found the right sided IV sitting the bed.  Unsure if she pulled it out, or cause she is diaphoretic and it came out. But I mentioned this to the nurse and she will have someone go look at them.

## 2019-10-10 DIAGNOSIS — J9601 Acute respiratory failure with hypoxia: Secondary | ICD-10-CM

## 2019-10-10 DIAGNOSIS — D509 Iron deficiency anemia, unspecified: Secondary | ICD-10-CM

## 2019-10-10 DIAGNOSIS — U071 COVID-19: Principal | ICD-10-CM

## 2019-10-10 MED ORDER — ONDANSETRON HCL 4 MG PO TABS
4.0000 mg | ORAL_TABLET | Freq: Four times a day (QID) | ORAL | Status: DC | PRN
  Administered 2019-10-12: 4 mg via ORAL
  Filled 2019-10-10 (×3): qty 1

## 2019-10-10 MED ORDER — ASCORBIC ACID 500 MG PO TABS
500.0000 mg | ORAL_TABLET | Freq: Every day | ORAL | Status: DC
  Administered 2019-10-10 – 2019-10-13 (×4): 500 mg via ORAL
  Filled 2019-10-10 (×5): qty 1

## 2019-10-10 MED ORDER — ONDANSETRON HCL 4 MG/2ML IJ SOLN
4.0000 mg | Freq: Four times a day (QID) | INTRAMUSCULAR | Status: DC | PRN
  Administered 2019-10-10 – 2019-10-13 (×2): 4 mg via INTRAVENOUS
  Filled 2019-10-10 (×2): qty 2

## 2019-10-10 MED ORDER — DEXAMETHASONE SODIUM PHOSPHATE 10 MG/ML IJ SOLN
6.0000 mg | INTRAMUSCULAR | Status: DC
  Administered 2019-10-10 – 2019-10-12 (×3): 6 mg via INTRAVENOUS
  Filled 2019-10-10 (×3): qty 1

## 2019-10-10 MED ORDER — ALBUTEROL SULFATE HFA 108 (90 BASE) MCG/ACT IN AERS
2.0000 | INHALATION_SPRAY | Freq: Four times a day (QID) | RESPIRATORY_TRACT | Status: DC
  Administered 2019-10-10 – 2019-10-12 (×9): 2 via RESPIRATORY_TRACT
  Filled 2019-10-10: qty 6.7

## 2019-10-10 MED ORDER — ACETAMINOPHEN 325 MG PO TABS
650.0000 mg | ORAL_TABLET | Freq: Four times a day (QID) | ORAL | Status: DC | PRN
  Administered 2019-10-10: 650 mg via ORAL
  Filled 2019-10-10: qty 2

## 2019-10-10 MED ORDER — ENOXAPARIN SODIUM 40 MG/0.4ML ~~LOC~~ SOLN
40.0000 mg | SUBCUTANEOUS | Status: DC
  Administered 2019-10-10: 19:00:00 40 mg via SUBCUTANEOUS
  Filled 2019-10-10 (×2): qty 0.4

## 2019-10-10 MED ORDER — ZINC SULFATE 220 (50 ZN) MG PO CAPS
220.0000 mg | ORAL_CAPSULE | Freq: Every day | ORAL | Status: DC
  Administered 2019-10-10 – 2019-10-13 (×4): 220 mg via ORAL
  Filled 2019-10-10 (×5): qty 1

## 2019-10-10 MED ORDER — GUAIFENESIN-DM 100-10 MG/5ML PO SYRP
10.0000 mL | ORAL_SOLUTION | ORAL | Status: DC | PRN
  Administered 2019-10-10 – 2019-10-12 (×2): 10 mL via ORAL
  Filled 2019-10-10 (×3): qty 10

## 2019-10-10 MED ORDER — HYDROCOD POLST-CPM POLST ER 10-8 MG/5ML PO SUER
5.0000 mL | Freq: Two times a day (BID) | ORAL | Status: DC | PRN
  Filled 2019-10-10: qty 5

## 2019-10-10 NOTE — H&P (Signed)
History and Physical    Kristen Kline XBD:[id number] DOB: 08-25-1985 DOA: 10/09/2019  PCP: Patient, No Pcp Per  Patient coming from: [place] have personally briefly reviewed patient's old medical records in [place]  Chief Complaint: Shortness of breath  HPI: Kristen Kline is a 34 y.o. female with medical history significant for morbid obesity who presented to [place] ED on 10/09/2019 for evaluation of body aches, fever to 102, diarrhea, shortness of breath, and cough productive of clear sputum. She had decreased oral intake and associated fatigue with generalized weakness.  She has been having nausea with an episode of emesis yesterday.  Her symptoms have been progressive over the last 6 days.  She was noted to have a positive COVID-19 test on 10/05/2019 and says her husband also had a positive COVID-19 infection.  She is currently having continued shortness of breath, worsened with exertion, but also having some shallow breathing while resting.  She is having continued chills.  She denies any chest pain, abdominal pain, or dysuria.  She denies any obvious bleeding other than regular menses.    She states she does not take any medications other than vitamin supplements.  Kristen Kline ED Course (10/09/2019):  Initial vitals showed BP 126/79, pulse 112, RR 22, temp 100.0 Fahrenheit, SPO2 94% on room air.  Labs are notable for WBC 3.3, hemoglobin 9.5, MCV 70.1, hematocrit 31.2, platelets 374,000, sodium 136, potassium 3.8, bicarb 22, BUN 8, creatinine 0.81, AST 70, ALT 32, alk phos 57, total bilirubin 0.2, lactic acid 1.1, D-dimer 0.56, procalcitonin <0.10, LDH 322, ferritin 95, fibrinogen 573, CRP 2.4, beta-hCG <1.  Blood cultures were collected and no growth to date.  Portable chest x-ray showed hazy opacification over the right region and right base.  Patient was given IV Decadron 6 mg once on 10/09/2019.  The hospitalist service was consulted to  admit/transfer to [facility] for further management.  Transfer was delayed due to no bed availability until today.  She was started on IV remdesivir with first dose on 10/09/2019 and received a second dose earlier today on 10/10/2019.  Per ED documentation she was noted to have SPO2 dropped to 88% morning of 10/10/2019 which improved to 98% when placed on 2 L supplemental O2 via [place].  Review of Systems: All systems reviewed and are negative except as documented in history of present illness above.   Past Medical History:  Diagnosis Date  . Anxiety   . Hx of cholecystectomy   . Morbid obesity ([place])     Past Surgical History:  Procedure Laterality Date  . CESAREAN SECTION    . CHOLECYSTECTOMY    . WISDOM TOOTH EXTRACTION      Social History:  reports that she has quit smoking. She has never used smokeless tobacco. She reports that she does not drink alcohol or use drugs.  No Known Allergies  Family History  Problem Relation Age of Onset  . Heart failure Mother   . Diabetes Mother   . Hypertension Father   . Stroke Paternal Grandfather   . Stroke Paternal Grandmother      Prior to Admission medications   Medication Sig Start Date End Date Taking? Authorizing Provider  ibuprofen (ADVIL) 600 MG tablet Take 1 tablet (600 mg total) by mouth every 6 (six) hours as needed. 03/13/19  Yes Jacqlyn Larsen, PA-C  Multiple Vitamin (MULTIVITAMIN) tablet Take 1 tablet by mouth daily.   Yes [provider]  cephALEXin (KEFLEX) 500 MG capsule Take 1 capsule (500 mg total) by mouth 3 (three) times daily. 07/22/18   Ripley Fraise, MD    Physical Exam: Vitals:   10/10/19 1730 10/10/19 1800 10/10/19 1830 10/10/19 1908  BP:  115/73  138/86  Pulse: 96 98 91 (!) 105  Resp:  20  20  Temp:  99.3 F (37.4 C)    TempSrc:  Oral    SpO2: 98% 99% 100% 98%  Weight:      Height:       Constitutional: Obese woman resting supine in bed, NAD, calm, comfortable Eyes: PERRL, lids and conjunctivae  normal ENMT: Mucous membranes are moist. Posterior pharynx clear of any exudate or lesions.  Neck: normal, supple, no masses. Respiratory: clear to auscultation bilaterally, no wheezing, no crackles. Normal respiratory effort. No accessory muscle use.  Cardiovascular: Regular rate and rhythm, no murmurs / rubs / gallops. No extremity edema. 2+ pedal pulses. Abdomen: Mild periumbilical tenderness, no masses palpated. No hepatosplenomegaly. Bowel sounds positive.  Musculoskeletal: no clubbing / cyanosis. No joint deformity upper and lower extremities. Good ROM, no contractures. Normal muscle tone.  Skin: no rashes, lesions, ulcers. No induration Neurologic: CN 2-12 grossly intact. Sensation intact, Strength 5/5 in all 4.  Psychiatric: Normal judgment and insight. Alert and oriented x 3. Normal mood.    Labs on Admission: I have personally reviewed following labs and imaging studies  CBC: Recent Labs  Lab 10/09/19 1314  WBC 3.3*  NEUTROABS 1.9  HGB 9.5*  HCT 31.2*  MCV 70.1*  PLT 885   Basic Metabolic Panel: Recent Labs  Lab 10/09/19 1314  NA 136  K 3.8  CL 104  CO2 22  GLUCOSE 121*  BUN 8  CREATININE 0.81  CALCIUM 8.3*   GFR: Estimated Creatinine Clearance: 160.3 mL/min (by C-G formula based on SCr of 0.81 mg/dL). Liver Function Tests: Recent Labs  Lab 10/09/19 1314  AST 70*  ALT 32  ALKPHOS 57  BILITOT 0.2*  PROT 7.9  ALBUMIN 3.5   No results for input(s): LIPASE, AMYLASE in the last 168 hours. No results for input(s): AMMONIA in the last 168 hours. Coagulation Profile: No results for input(s): INR, PROTIME in the last 168 hours. Cardiac Enzymes: No results for input(s): CKTOTAL, CKMB, CKMBINDEX, TROPONINI in the last 168 hours. BNP (last 3 results) No results for input(s): PROBNP in the last 8760 hours. HbA1C: No results for input(s): HGBA1C in the last 72 hours. CBG: No results for input(s): GLUCAP in the last 168 hours. Lipid Profile: Recent Labs     10/09/19 1314  TRIG 105   Thyroid Function Tests: No results for input(s): TSH, T4TOTAL, FREET4, T3FREE, THYROIDAB in the last 72 hours. Anemia Panel: Recent Labs    10/09/19 1314  FERRITIN 95   Urine analysis:    Component Value Date/Time   COLORURINE YELLOW 07/22/2018 0154   APPEARANCEUR CLOUDY (A) 07/22/2018 0154   LABSPEC 1.020 07/22/2018 0154   PHURINE 6.0 07/22/2018 0154   GLUCOSEU NEGATIVE 07/22/2018 0154   HGBUR LARGE (A) 07/22/2018 0154   BILIRUBINUR NEGATIVE 07/22/2018 0154   KETONESUR NEGATIVE 07/22/2018 0154   PROTEINUR NEGATIVE 07/22/2018 0154   UROBILINOGEN 0.2 04/13/2010 0324   NITRITE NEGATIVE 07/22/2018 0154   LEUKOCYTESUR SMALL (A) 07/22/2018 0154    Radiological Exams on Admission: DG Chest Port 1 View  Result Date: 10/09/2019 CLINICAL DATA:  Shortness of breath.  COVID-19 positive. EXAM: PORTABLE CHEST 1 VIEW COMPARISON:  05/29/2010 FINDINGS: Lungs are  hypoinflated with hazy opacification over the right perihilar region and right lung base which could be seen due to asymmetric edema versus infection. No evidence of effusion. Cardiomediastinal silhouette and remainder of the exam is unchanged. IMPRESSION: Hazy opacification over the right perihilar region and right base which may be due to asymmetric vascular congestion versus infection. Electronically Signed   By: Marin Olp M.D.   On: 10/09/2019 13:29    EKG: Independently reviewed. Sinus tachycardia, no acute ischemic changes.  No prior for comparison.  Assessment/Plan Principal Problem:   Acute hypoxemic respiratory failure due to COVID-19 [facility]) Active Problems:   Microcytic anemia  Charene EMILIANNA BARLOWE is a 34 y.o. female with medical history significant for morbid obesity who is admitted for acute hypoxemic respiratory failure due to COVID-19 pneumonia.   Acute hypoxemic respiratory failure due to COVID-19 pneumonia: Positive SARS-CoV-2 test on 10/05/2019.  Hazy right-sided opacifications seen  on chest x-ray.  Currently requiring 2 L supplemental O2 via [place], does not require oxygen at home.  She was started on IV remdesivir at [facility]. -Continue IV remdesivir per pharmacy protocol: Day 2/5 -Continue IV Decadron 6 mg daily: Day 2/5 -Continue supplemental oxygen as needed and wean down as able -Continue vitamin C, zinc, antitussives -Continue incentive spirometer, flutter valve, albuterol inhaler -Monitor daily labs  Microcytic anemia: Likely iron deficiency, ferritin 95 in setting of acute illness.  Check iron and TIBC.  No obvious abnormal bleeding, continue to monitor.  DVT prophylaxis: Lovenox Code Status: Full code, confirmed with patient Family Communication: Discussed with patient, she has discussed with family Disposition Plan: From home, likely discharge to home pending symptomatic improvement, ability to wean off oxygen, and ability to transition to outpatient IV remdesivir treatment versus completion in [place]. Consults called: None Admission status:  Status is: Inpatient  Remains inpatient appropriate because:IV treatments appropriate due to intensity of illness or inability to take PO and Inpatient level of care appropriate due to severity of illness  Dispo: The patient is from: Home              Anticipated d/c is to: Home              Anticipated d/c date is: 3 days              Patient currently is not medically stable to d/c.  Zada Finders MD [facility]  If 7PM-7AM, please contact night-coverage [email or web address]  10/10/2019, 7:58 PM

## 2019-10-10 NOTE — ED Notes (Signed)
Pt awake  No acute distress noted  Reconnected leads to monitor and readjusted BP cuff  Comfort measures performed  Pt denies any needs at this time

## 2019-10-10 NOTE — ED Notes (Signed)
Called Bed Placement to check on pt bed and spoke to [facility] who stated that there are no beds currently available and pt is next up

## 2019-10-10 NOTE — Progress Notes (Signed)
Placed patient on 2 liter nasal cannula due to patient's SPO2 dropping to 88%.  Patient's SPO2 increased to 98%.

## 2019-10-10 NOTE — ED Notes (Signed)
Pt offered and accepted chickn wrap dinner that was provided for ED lunch today. Pt verbalizes no needs at this time, denies pain and is resting comfortably. Room phone at bedside for patients needs and for ED staff to contact patient.

## 2019-10-10 NOTE — ED Notes (Signed)
Call bed placement and spoke to [facility] about pt's bed and she stated that Gearldine Bienenstock will call to advise

## 2019-10-11 DIAGNOSIS — J1282 Pneumonia due to coronavirus disease 2019: Secondary | ICD-10-CM

## 2019-10-11 DIAGNOSIS — D509 Iron deficiency anemia, unspecified: Secondary | ICD-10-CM

## 2019-10-11 LAB — LACTATE DEHYDROGENASE: LDH: 313 U/L — ABNORMAL HIGH (ref 98–192)

## 2019-10-11 LAB — COMPREHENSIVE METABOLIC PANEL
ALT: 48 U/L — ABNORMAL HIGH (ref 0–44)
AST: 89 U/L — ABNORMAL HIGH (ref 15–41)
Albumin: 3.3 g/dL — ABNORMAL LOW (ref 3.5–5.0)
Alkaline Phosphatase: 54 U/L (ref 38–126)
Anion gap: 7 (ref 5–15)
BUN: 13 mg/dL (ref 6–20)
CO2: 24 mmol/L (ref 22–32)
Calcium: 8.5 mg/dL — ABNORMAL LOW (ref 8.9–10.3)
Chloride: 108 mmol/L (ref 98–111)
Creatinine, Ser: 0.78 mg/dL (ref 0.44–1.00)
GFR calc Af Amer: >60 mL/min (ref >60)
GFR calc non Af Amer: >60 mL/min (ref >60)
Glucose, Bld: 152 mg/dL — ABNORMAL HIGH (ref 70–99)
Potassium: 4.1 mmol/L (ref 3.5–5.1)
Sodium: 139 mmol/L (ref 135–145)
Total Bilirubin: 0.4 mg/dL (ref 0.3–1.2)
Total Protein: 7.6 g/dL (ref 6.5–8.1)

## 2019-10-11 LAB — D-DIMER, QUANTITATIVE: D-Dimer, Quant: 0.41 ug/mL-FEU (ref 0.00–0.50)

## 2019-10-11 LAB — CBC WITH DIFFERENTIAL/PLATELET
Abs Immature Granulocytes: 0.03 10*3/uL (ref 0.00–0.07)
Basophils Absolute: 0 10*3/uL (ref 0.0–0.1)
Basophils Relative: 0 %
Eosinophils Absolute: 0 10*3/uL (ref 0.0–0.5)
Eosinophils Relative: 0 %
HCT: 29.9 % — ABNORMAL LOW (ref 36.0–46.0)
Hemoglobin: 8.6 g/dL — ABNORMAL LOW (ref 12.0–15.0)
Immature Granulocytes: 1 %
Lymphocytes Relative: 32 %
Lymphs Abs: 0.9 10*3/uL (ref 0.7–4.0)
MCH: 21.3 pg — ABNORMAL LOW (ref 26.0–34.0)
MCHC: 28.8 g/dL — ABNORMAL LOW (ref 30.0–36.0)
MCV: 74 fL — ABNORMAL LOW (ref 80.0–100.0)
Monocytes Absolute: 0.3 10*3/uL (ref 0.1–1.0)
Monocytes Relative: 10 %
Neutro Abs: 1.6 10*3/uL — ABNORMAL LOW (ref 1.7–7.7)
Neutrophils Relative %: 57 %
Platelets: 358 10*3/uL (ref 150–400)
RBC: 4.04 MIL/uL (ref 3.87–5.11)
RDW: 17.6 % — ABNORMAL HIGH (ref 11.5–15.5)
WBC: 2.8 10*3/uL — ABNORMAL LOW (ref 4.0–10.5)
nRBC: 0 % (ref 0.0–0.2)

## 2019-10-11 LAB — BRAIN NATRIURETIC PEPTIDE: B Natriuretic Peptide: 7.5 pg/mL (ref 0.0–100.0)

## 2019-10-11 LAB — FERRITIN: Ferritin: 88 ng/mL (ref 11–307)

## 2019-10-11 LAB — IRON AND TIBC
Iron: 17 ug/dL — ABNORMAL LOW (ref 28–170)
Saturation Ratios: 5 % — ABNORMAL LOW (ref 10.4–31.8)
TIBC: 346 ug/dL (ref 250–450)
UIBC: 329 ug/dL

## 2019-10-11 LAB — C-REACTIVE PROTEIN
CRP: 2 mg/dL — ABNORMAL HIGH (ref <1.0)
CRP: 2.1 mg/dL — ABNORMAL HIGH (ref <1.0)

## 2019-10-11 LAB — HIV ANTIBODY (ROUTINE TESTING W REFLEX): HIV Screen 4th Generation wRfx: NONREACTIVE

## 2019-10-11 MED ORDER — ENOXAPARIN SODIUM 80 MG/0.8ML ~~LOC~~ SOLN
0.5000 mg/kg | SUBCUTANEOUS | Status: DC
  Administered 2019-10-11 – 2019-10-12 (×2): 80 mg via SUBCUTANEOUS
  Filled 2019-10-11 (×2): qty 0.8

## 2019-10-11 NOTE — Progress Notes (Signed)
Patient ID: Kristen Kline, female   DOB: 1985-12-09, 34 y.o.   MRN: [id number]  PROGRESS NOTE    Kristen Kline  LEX:[id number] DOB: 02/18/1986 DOA: 10/09/2019 PCP: Patient, No Pcp Per    Brief Narrative:  Kristen Kline is a 34 y.o. female with medical history significant for morbid obesity who presented to [facility] ED on 10/09/2019 for evaluation of body aches, fever to 102, diarrhea, shortness of breath, and cough productive of clear sputum. She had decreased oral intake and associated fatigue with generalized weakness.  She has been having nausea with an episode of emesis.  Her symptoms have been progressive over the last 6 days.  She was noted to have a positive COVID-19 test on 10/05/2019 and says her husband also had a positive COVID-19 infection.  She is currently having continued shortness of breath, worsened with exertion, but also having some shallow breathing while resting.  She is having continued chills.  She denies any chest pain, abdominal pain, or dysuria.  She denies any obvious bleeding other than regular menses.   [facility] ED Course (10/09/2019):  Initial vitals showed BP 126/79, pulse 112, RR 22, temp 100.0 Fahrenheit, SPO2 94% on room air.  Blood cultures were collected and no growth to date.  Portable chest x-ray showed hazy opacification over the right region and right base.  Patient was given IV Decadron 6 mg.  She was started on IV remdesivir with first dose on 10/09/2019 and received a second dose earlier today on 10/10/2019.  Per ED documentation she was noted to have SPO2 dropped to 88% morning of 10/10/2019 which improved to 98% when placed on 2 L supplemental O2 via [place].  Assessment & Plan:   Principal Problem:   Acute hypoxemic respiratory failure due to COVID-19 [facility]) Active Problems:   Microcytic anemia  Acute hypoxemic respiratory failure due to COVID-19 pneumonia: Positive SARS-CoV-2 test on 10/05/2019.  Hazy right-sided  opacifications seen on chest x-ray.  Currently requiring 2 L supplemental O2 via [place], does not require oxygen at home.  She was started on IV remdesivir at [facility]. -Continue IV remdesivir per pharmacy protocol: Day 3/5 -Continue IV Decadron 6 mg daily: Day 3/5 -Continue supplemental oxygen as needed and wean down as able--on 1.5 L/min today -Continue vitamin C, zinc, antitussives -Continue incentive spirometer, flutter valve, albuterol inhaler -Monitor daily labs  Microcytic anemia: Likely iron deficiency, ferritin 95 in setting of acute illness.  Check iron and TIBC.  No obvious abnormal bleeding, continue to monitor.  Morbid Obesity: Patient would benefit from outpatient lifestyle modifications  DVT prophylaxis: Lovenox SQ Code Status: Full code  Family Communication: Husband on FaceTime while in the room with patient. Disposition Plan: Home, pending clinical improvement  Consultants:   None  Procedures:  None  Antimicrobials: Anti-infectives (From admission, onward)   Start     Dose/Rate Route Frequency Ordered Stop   10/10/19 1000  remdesivir 100 mg in sodium chloride 0.9 % 100 mL IVPB     100 mg 200 mL/hr over 30 Minutes Intravenous Daily 10/09/19 1422 10/14/19 0959   10/09/19 1430  remdesivir 100 mg in sodium chloride 0.9 % 100 mL IVPB     100 mg 200 mL/hr over 30 Minutes Intravenous Every 30 min 10/09/19 1422 10/09/19 1607       Subjective: Breathing is somewhat improved. Still SOB with least bit of exertion.  Objective: Vitals:   10/11/19 0448 10/11/19 4496 10/11/19 0810 10/11/19 1203  BP:  120/77  125/70 (!) 146/84  Pulse: 70  79 84  Resp: 20  18 (!) 22  Temp: 98 F (36.7 C)  97.7 F (36.5 C) 97.6 F (36.4 C)  TempSrc: Oral  Oral Oral  SpO2: 100% 98% 97% 95%  Weight:      Height:        Intake/Output Summary (Last 24 hours) at 10/11/2019 1206 Last data filed at 10/11/2019 0450 Gross per 24 hour  Intake 790 ml  Output --  Net 790 ml   Filed  Weights   10/09/19 1220  Weight: (!) 163.3 kg    Examination:  General exam: Appears calm and comfortable  Respiratory system: Clear to auscultation. Respiratory effort normal. Cardiovascular system: S1 & S2 heard, RRR.  Gastrointestinal system: Abdomen is nondistended, soft and nontender.  Central nervous system: Alert and oriented. No focal neurological deficits. Extremities: Symmetric  Skin: No rashes Psychiatry: Judgement and insight appear normal. Mood & affect appropriate.   Data Reviewed: I have personally reviewed following labs and imaging studies  CBC: Recent Labs  Lab 10/09/19 1314 10/11/19 0521  WBC 3.3* 2.8*  NEUTROABS 1.9 1.6*  HGB 9.5* 8.6*  HCT 31.2* 29.9*  MCV 70.1* 74.0*  PLT 374 712   Basic Metabolic Panel: Recent Labs  Lab 10/09/19 1314 10/11/19 0521  NA 136 139  K 3.8 4.1  CL 104 108  CO2 22 24  GLUCOSE 121* 152*  BUN 8 13  CREATININE 0.81 0.78  CALCIUM 8.3* 8.5*   GFR: Estimated Creatinine Clearance: 162.3 mL/min (by C-G formula based on SCr of 0.78 mg/dL). Liver Function Tests: Recent Labs  Lab 10/09/19 1314 10/11/19 0521  AST 70* 89*  ALT 32 48*  ALKPHOS 57 54  BILITOT 0.2* 0.4  PROT 7.9 7.6  ALBUMIN 3.5 3.3*   Lipid Profile: Recent Labs    10/09/19 1314  TRIG 105   Anemia Panel: Recent Labs    10/09/19 1314 10/11/19 0521  FERRITIN 95 88  TIBC  --  346  IRON  --  17*   Sepsis Labs: Recent Labs  Lab 10/09/19 1314  PROCALCITON <0.10  LATICACIDVEN 1.1    Recent Results (from the past 240 hour(s))  Novel Coronavirus, NAA ([facility])     Status: Abnormal   Collection Time: 10/05/19 11:19 AM   Specimen: Nasopharyngeal(NP) swabs in vial transport medium   NASOPHARYNGE  TESTING  Result Value Ref Range Status   SARS-CoV-2, NAA Detected (A) Not Detected Final    Comment: Patients who have a positive COVID-19 test result may now have treatment options. Treatment options are available for patients with mild to  moderate symptoms and for hospitalized patients. Visit our website at [email or web address] for resources and information. This nucleic acid amplification test was developed and its performance characteristics determined by [employer]. Nucleic acid amplification tests include RT-PCR and TMA. This test has not been FDA cleared or approved. This test has been authorized by FDA under an Emergency Use Authorization (EUA). This test is only authorized for the duration of time the declaration that circumstances exist justifying the authorization of the emergency use of in vitro diagnostic tests for detection of SARS-CoV-2 virus and/or diagnosis of COVID-19 infection under section 564(b)(1) of the Act, 21 U.S.C. 458KDX-8(P) (1), unless the authorization is terminated or revoked sooner. When diagnostic testing is negativ e, the possibility of a false negative result should be considered in the context of a patient's recent exposures and the presence of clinical signs and symptoms  consistent with COVID-19. An individual without symptoms of COVID-19 and who is not shedding SARS-CoV-2 virus would expect to have a negative (not detected) result in this assay.   SARS-COV-2, NAA 2 DAY TAT     Status: None   Collection Time: 10/05/19 11:19 AM   NASOPHARYNGE  TESTING  Result Value Ref Range Status   SARS-CoV-2, NAA 2 DAY TAT Performed  Final  Blood Culture (routine x 2)     Status: None (Preliminary result)   Collection Time: 10/09/19  1:25 PM   Specimen: BLOOD  Result Value Ref Range Status   Specimen Description   Final    BLOOD LEFT ANTECUBITAL Performed at [facility], [address] [place], [place] [address]    Special Requests   Final    BOTTLES DRAWN AEROBIC AND ANAEROBIC Blood Culture adequate volume Performed at [facility], [address] [place], [place] [address]    Culture   Final    NO GROWTH 2 DAYS Performed at [facility], [address], [place], [place] [address]    Report Status PENDING  Incomplete  Blood Culture (routine x 2)     Status: None (Preliminary result)   Collection Time: 10/09/19  1:40 PM   Specimen: BLOOD  Result Value Ref Range Status   Specimen Description   Final    BLOOD RIGHT ANTECUBITAL Performed at [facility], [address] [place], [place] [address]    Special Requests   Final    BOTTLES DRAWN AEROBIC AND ANAEROBIC Blood Culture adequate volume Performed at [facility], [address] [place], [place] [address]    Culture   Final    NO GROWTH 2 DAYS Performed at [facility], [address], [place], [place] [address]    Report Status PENDING  Incomplete      Radiology Studies: DG Chest Port 1 View  Result Date: 10/09/2019 CLINICAL DATA:  Shortness of breath.  COVID-19 positive. EXAM: PORTABLE CHEST 1 VIEW COMPARISON:  05/29/2010 FINDINGS: Lungs are hypoinflated with hazy opacification over the right perihilar region and right lung base which could be seen due to asymmetric edema versus infection. No evidence of effusion. Cardiomediastinal silhouette and remainder of the exam is unchanged. IMPRESSION: Hazy opacification over the right perihilar region and right base which may be due to asymmetric vascular congestion versus infection. Electronically Signed   By: Elberta Fortis M.D.   On: 10/09/2019 13:29     Scheduled Meds: . albuterol  2 puff Inhalation Q6H  . vitamin C  500 mg Oral Daily  . dexamethasone (DECADRON) injection  6 mg Intravenous Q24H  . enoxaparin (LOVENOX) injection  40 mg Subcutaneous Q24H  . zinc sulfate  220 mg Oral Daily   Continuous Infusions: . remdesivir 100 mg in NS 100 mL 100 mg (10/11/19 1040)     LOS: 2 days    Reva Bores, MD 10/11/2019 12:06 PM [phone] [facility] If 7PM-7AM, please contact night-coverage 10/11/2019, 12:06 PM

## 2019-10-11 NOTE — Progress Notes (Signed)
Dr. Allena Katz at bedside, RN notified him that the patient's HR increased to 150s when up to the bathroom, deep breathing exercised performed, HR down to low 100s.

## 2019-10-11 NOTE — Plan of Care (Signed)
°  Problem: Education: °Goal: Knowledge of General Education information will improve °Description: Including pain rating scale, medication(s)/side effects and non-pharmacologic comfort measures °Outcome: Progressing °  °Problem: Health Behavior/Discharge Planning: °Goal: Ability to manage health-related needs will improve °Outcome: Progressing °  °Problem: Clinical Measurements: °Goal: Respiratory complications will improve °Outcome: Progressing °Goal: Cardiovascular complication will be avoided °Outcome: Progressing °  °Problem: Activity: °Goal: Risk for activity intolerance will decrease °Outcome: Progressing °  °Problem: Coping: °Goal: Level of anxiety will decrease °Outcome: Progressing °  °Problem: Elimination: °Goal: Will not experience complications related to bowel motility °Outcome: Progressing °Goal: Will not experience complications related to urinary retention °Outcome: Progressing °  °Problem: Pain Managment: °Goal: General experience of comfort will improve °Outcome: Progressing °  °Problem: Safety: °Goal: Ability to remain free from injury will improve °Outcome: Progressing °  °Problem: Skin Integrity: °Goal: Risk for impaired skin integrity will decrease °Outcome: Progressing °  °

## 2019-10-11 NOTE — Progress Notes (Signed)
RN updated patient's husband on patient status with verbal consent from patient. Patient's husband states he will call back with any questions he has.

## 2019-10-11 NOTE — Progress Notes (Signed)
Patient's husband called for an update on patient status. RN updated patient's husband and answered all questions. RN gave the patient's husband the number to reach the patient's dayshift RN.

## 2019-10-11 NOTE — Progress Notes (Addendum)
Kristen Kline, Phlebotomist informed RN that the lab orders that were ordered at 1959 are unable to be collected unless they are reordered to be collected at [facility].  RN notified Linton Flemings, NP of the above information.  Linton Flemings, NP reordered all labs to be collected at 0500.

## 2019-10-11 NOTE — Progress Notes (Signed)
   10/10/19 2000  Assess: MEWS Score  Temp (!) 103.1 F (39.5 C)  BP 134/83  Pulse Rate (!) 102  Resp 18  Level of Consciousness Alert  SpO2 99 %  O2 Device Nasal Cannula  Patient Activity (if Appropriate) In bed  O2 Flow Rate (L/min) 3 L/min  Assess: MEWS Score  MEWS Temp 2  MEWS Systolic 0  MEWS Pulse 1  MEWS RR 0  MEWS LOC 0  MEWS Score 3  MEWS Score Color Yellow  Assess: if the MEWS score is Yellow or Red  Were vital signs taken at a resting state? Yes  Focused Assessment Documented focused assessment  Early Detection of Sepsis Score *See Row Information* Low  MEWS guidelines implemented *See Row Information* Yes  Treat  MEWS Interventions Administered scheduled meds/treatments;Administered prn meds/treatments  Take Vital Signs  Increase Vital Sign Frequency  Yellow: Q 2hr X 2 then Q 4hr X 2, if remains yellow, continue Q 4hrs  Escalate  MEWS: Escalate Yellow: discuss with charge nurse/RN and consider discussing with provider and RRT  Notify: Charge Nurse/RN  Name of Charge Nurse/RN Notified Kristine, RN  Date Charge Nurse/RN Notified 10/10/19  Time Charge Nurse/RN Notified 2030  Document  Patient Outcome Stabilized after interventions  Progress note created (see row info) Yes   CN notified. Pt advanced to MEWS q2h x2. Tylenol given PRN per order. Patient's Temperature 98.31F after interventions. MEWS green. Will continue with MEWS yellow protocol.

## 2019-10-11 NOTE — Progress Notes (Signed)
RN notified Lab/phelbotomy that the STAT labs have not been drawn that were ordered 1959. Rodney Booze, Phlebotomist states Lab did not see that those were ordered and she will collect those labs next.

## 2019-10-11 NOTE — Progress Notes (Signed)
Patient off oxygen, stat 95-100%-RA, encouraging I/S, educated patient to inform staff of any SOB/difficulty breathing, will continue to monitor patient.

## 2019-10-12 LAB — CBC WITH DIFFERENTIAL/PLATELET
Abs Immature Granulocytes: 0.05 10*3/uL (ref 0.00–0.07)
Basophils Absolute: 0 10*3/uL (ref 0.0–0.1)
Basophils Relative: 0 %
Eosinophils Absolute: 0 10*3/uL (ref 0.0–0.5)
Eosinophils Relative: 0 %
HCT: 29.6 % — ABNORMAL LOW (ref 36.0–46.0)
Hemoglobin: 8.5 g/dL — ABNORMAL LOW (ref 12.0–15.0)
Immature Granulocytes: 1 %
Lymphocytes Relative: 32 %
Lymphs Abs: 1.3 10*3/uL (ref 0.7–4.0)
MCH: 21.2 pg — ABNORMAL LOW (ref 26.0–34.0)
MCHC: 28.7 g/dL — ABNORMAL LOW (ref 30.0–36.0)
MCV: 73.8 fL — ABNORMAL LOW (ref 80.0–100.0)
Monocytes Absolute: 0.4 10*3/uL (ref 0.1–1.0)
Monocytes Relative: 9 %
Neutro Abs: 2.3 10*3/uL (ref 1.7–7.7)
Neutrophils Relative %: 58 %
Platelets: 412 10*3/uL — ABNORMAL HIGH (ref 150–400)
RBC: 4.01 MIL/uL (ref 3.87–5.11)
RDW: 17.8 % — ABNORMAL HIGH (ref 11.5–15.5)
WBC: 4 10*3/uL (ref 4.0–10.5)
nRBC: 0 % (ref 0.0–0.2)

## 2019-10-12 LAB — COMPREHENSIVE METABOLIC PANEL
ALT: 59 U/L — ABNORMAL HIGH (ref 0–44)
AST: 91 U/L — ABNORMAL HIGH (ref 15–41)
Albumin: 3.3 g/dL — ABNORMAL LOW (ref 3.5–5.0)
Alkaline Phosphatase: 54 U/L (ref 38–126)
Anion gap: 8 (ref 5–15)
BUN: 16 mg/dL (ref 6–20)
CO2: 24 mmol/L (ref 22–32)
Calcium: 8.6 mg/dL — ABNORMAL LOW (ref 8.9–10.3)
Chloride: 107 mmol/L (ref 98–111)
Creatinine, Ser: 0.76 mg/dL (ref 0.44–1.00)
GFR calc Af Amer: >60 mL/min (ref >60)
GFR calc non Af Amer: >60 mL/min (ref >60)
Glucose, Bld: 143 mg/dL — ABNORMAL HIGH (ref 70–99)
Potassium: 4.3 mmol/L (ref 3.5–5.1)
Sodium: 139 mmol/L (ref 135–145)
Total Bilirubin: 0.3 mg/dL (ref 0.3–1.2)
Total Protein: 7.5 g/dL (ref 6.5–8.1)

## 2019-10-12 LAB — MAGNESIUM: Magnesium: 2.3 mg/dL (ref 1.7–2.4)

## 2019-10-12 LAB — D-DIMER, QUANTITATIVE: D-Dimer, Quant: 0.37 ug/mL-FEU (ref 0.00–0.50)

## 2019-10-12 LAB — C-REACTIVE PROTEIN: CRP: 1.6 mg/dL — ABNORMAL HIGH (ref <1.0)

## 2019-10-12 LAB — FERRITIN: Ferritin: 66 ng/mL (ref 11–307)

## 2019-10-12 LAB — PHOSPHORUS: Phosphorus: 4.5 mg/dL (ref 2.5–4.6)

## 2019-10-12 MED ORDER — SODIUM CHLORIDE 0.9 % IV SOLN
510.0000 mg | Freq: Once | INTRAVENOUS | Status: AC
  Administered 2019-10-12: 510 mg via INTRAVENOUS
  Filled 2019-10-12: qty 510
  Filled 2019-10-12: qty 17

## 2019-10-12 MED ORDER — FERROUS SULFATE 325 (65 FE) MG PO TABS
325.0000 mg | ORAL_TABLET | Freq: Every day | ORAL | Status: DC
  Administered 2019-10-13: 325 mg via ORAL
  Filled 2019-10-12: qty 1

## 2019-10-12 MED ORDER — ALBUTEROL SULFATE HFA 108 (90 BASE) MCG/ACT IN AERS
2.0000 | INHALATION_SPRAY | Freq: Three times a day (TID) | RESPIRATORY_TRACT | Status: DC
  Administered 2019-10-13: 2 via RESPIRATORY_TRACT

## 2019-10-12 NOTE — Progress Notes (Signed)
PROGRESS NOTE    MORGANNA Kline  VEL:[id number] DOB: 1986/04/19 DOA: 10/09/2019 PCP: Patient, No Pcp Per   Brief Narrative: Burt Ek Williamsonis a 34 y.o.femalewith medical history significant formorbid obesity who presented to [facility] on 10/09/2019 for evaluation of body aches, fever to 102, diarrhea, shortness of breath, and cough  with  sputum. She had decreased oral intake and associated fatigue with generalized weakness.She was noted to have a positive COVID-19 test on 4/26/2021and says her husband also had a positive COVID-19 infection. Chest x-ray on presentation showed hazy opacity on the right side.  Started on remdesivir and Decadron.  Currently on room air.  Inflammatory markers improving.  Plan for discharge tomorrow to home.  Assessment & Plan:   Principal Problem:   Acute hypoxemic respiratory failure due to COVID-19 [facility]) Active Problems:   Microcytic anemia   Acute hypoxemic respiratory failure due to COVID-19 pneumonia: Positive SARS-CoV-2 test on 10/05/2019. Hazy right-sided opacificationsseen on chest x-ray.  She was started on IV remdesivir at [facility].  She was hypoxic on presentation requiring oxygen supplementation.  This morning she was on room air. -Continue IV remdesivir per pharmacy protocol: Day 4/5 -Continue IV Decadron 6 mg daily: Day 4/5 -Continue vitamin C, zinc, antitussives -Continue incentive spirometer, flutter valve, albuterol inhaler -Monitor daily labs  Microcytic anemia: Iron studies showed low iron.  We will give her a dose of IV iron.  Start on supplementation with oral on discharge.  Microcytic anemia cud be associated  with menorrhagia.  Morbid Obesity: BMI of 58.1        DVT prophylaxis:Lovenox Code Status: Full Family Communication: Discussed with patient Status is: Inpatient  Remains inpatient appropriate because:IV treatments appropriate due to intensity of illness or inability to take  PO   Dispo: The patient is from: Home              Anticipated d/c is to: Home              Anticipated d/c date is: 1 day              Patient currently is not medically stable to d/c.    Consultants: None  Procedures:None  Antimicrobials:  Anti-infectives (From admission, onward)   Start     Dose/Rate Route Frequency Ordered Stop   10/10/19 1000  remdesivir 100 mg in sodium chloride 0.9 % 100 mL IVPB     100 mg 200 mL/hr over 30 Minutes Intravenous Daily 10/09/19 1422 10/14/19 0959   10/09/19 1430  remdesivir 100 mg in sodium chloride 0.9 % 100 mL IVPB     100 mg 200 mL/hr over 30 Minutes Intravenous Every 30 min 10/09/19 1422 10/09/19 1607      Subjective:  Patient seen and examined at bedside this morning.  Hemodynamically stable.  Comfortable.  Denies any shortness of breath or chest pain.  No cough.  Feels much better today.  Objective: Vitals:   10/11/19 1203 10/11/19 1703 10/11/19 2252 10/12/19 0525  BP: (!) 146/84 129/79 113/73 112/60  Pulse: 84 81 79 68  Resp: (!) 22 16 16 15   Temp: 97.6 F (36.4 C) 98 F (36.7 C) 98.9 F (37.2 C) 97.6 F (36.4 C)  TempSrc: Oral Axillary Oral Oral  SpO2: 95% 100% 96% 97%  Weight:      Height:        Intake/Output Summary (Last 24 hours) at 10/12/2019 1031 Last data filed at 10/11/2019 1300 Gross per 24 hour  Intake 240 ml  Output --  Net 240 ml   Filed Weights   10/09/19 1220  Weight: (!) 163.3 kg    Examination:  General exam: Appears calm and comfortable ,Not in distress, morbidly obese  HEENT:PERRL,Oral mucosa moist, Ear/Nose normal on gross exam Respiratory system: Bilateral equal air entry, normal vesicular breath sounds, no wheezes or crackles  Cardiovascular system: S1 & S2 heard, RRR. No JVD, murmurs, rubs, gallops or clicks. No pedal edema. Gastrointestinal system: Abdomen is nondistended, soft and nontender. No organomegaly or masses felt. Normal bowel sounds heard. Central nervous system: Alert and  oriented. No focal neurological deficits. Extremities: No edema, no clubbing ,no cyanosis, distal peripheral pulses palpable. Skin: No rashes, lesions or ulcers,no icterus ,no pallor   Data Reviewed: I have personally reviewed following labs and imaging studies  CBC: Recent Labs  Lab 10/09/19 1314 10/11/19 0521 10/12/19 0501  WBC 3.3* 2.8* 4.0  NEUTROABS 1.9 1.6* 2.3  HGB 9.5* 8.6* 8.5*  HCT 31.2* 29.9* 29.6*  MCV 70.1* 74.0* 73.8*  PLT 374 358 412*   Basic Metabolic Panel: Recent Labs  Lab 10/09/19 1314 10/11/19 0521 10/12/19 0501  NA 136 139 139  K 3.8 4.1 4.3  CL 104 108 107  CO2 22 24 24   GLUCOSE 121* 152* 143*  BUN 8 13 16   CREATININE 0.81 0.78 0.76  CALCIUM 8.3* 8.5* 8.6*  MG  --   --  2.3  PHOS  --   --  4.5   GFR: Estimated Creatinine Clearance: 162.3 mL/min (by C-G formula based on SCr of 0.76 mg/dL). Liver Function Tests: Recent Labs  Lab 10/09/19 1314 10/11/19 0521 10/12/19 0501  AST 70* 89* 91*  ALT 32 48* 59*  ALKPHOS 57 54 54  BILITOT 0.2* 0.4 0.3  PROT 7.9 7.6 7.5  ALBUMIN 3.5 3.3* 3.3*   No results for input(s): LIPASE, AMYLASE in the last 168 hours. No results for input(s): AMMONIA in the last 168 hours. Coagulation Profile: No results for input(s): INR, PROTIME in the last 168 hours. Cardiac Enzymes: No results for input(s): CKTOTAL, CKMB, CKMBINDEX, TROPONINI in the last 168 hours. BNP (last 3 results) No results for input(s): PROBNP in the last 8760 hours. HbA1C: No results for input(s): HGBA1C in the last 72 hours. CBG: No results for input(s): GLUCAP in the last 168 hours. Lipid Profile: Recent Labs    10/09/19 1314  TRIG 105   Thyroid Function Tests: No results for input(s): TSH, T4TOTAL, FREET4, T3FREE, THYROIDAB in the last 72 hours. Anemia Panel: Recent Labs    10/11/19 0521 10/12/19 0501  FERRITIN 88 66  TIBC 346  --   IRON 17*  --    Sepsis Labs: Recent Labs  Lab 10/09/19 1314  PROCALCITON <0.10   LATICACIDVEN 1.1    Recent Results (from the past 240 hour(s))  Novel Coronavirus, NAA ([facility])     Status: Abnormal   Collection Time: 10/05/19 11:19 AM   Specimen: Nasopharyngeal(NP) swabs in vial transport medium   NASOPHARYNGE  TESTING  Result Value Ref Range Status   SARS-CoV-2, NAA Detected (A) Not Detected Final    Comment: Patients who have a positive COVID-19 test result may now have treatment options. Treatment options are available for patients with mild to moderate symptoms and for hospitalized patients. Visit our website at 10/11/19 for resources and information. This nucleic acid amplification test was developed and its performance characteristics determined by 10/07/19. Nucleic acid amplification tests include RT-PCR and TMA. This  test has not been FDA cleared or approved. This test has been authorized by FDA under an Emergency Use Authorization (EUA). This test is only authorized for the duration of time the declaration that circumstances exist justifying the authorization of the emergency use of in vitro diagnostic tests for detection of SARS-CoV-2 virus and/or diagnosis of COVID-19 infection under section 564(b)(1) of the Act, 21 U.S.C. 470JGG-8(Z) (1), unless the authorization is terminated or revoked sooner. When diagnostic testing is negativ e, the possibility of a false negative result should be considered in the context of a patient's recent exposures and the presence of clinical signs and symptoms consistent with COVID-19. An individual without symptoms of COVID-19 and who is not shedding SARS-CoV-2 virus would expect to have a negative (not detected) result in this assay.   SARS-COV-2, NAA 2 DAY TAT     Status: None   Collection Time: 10/05/19 11:19 AM   NASOPHARYNGE  TESTING  Result Value Ref Range Status   SARS-CoV-2, NAA 2 DAY TAT Performed  Final  Blood Culture (routine x 2)     Status: None (Preliminary result)    Collection Time: 10/09/19  1:25 PM   Specimen: BLOOD  Result Value Ref Range Status   Specimen Description   Final    BLOOD LEFT ANTECUBITAL Performed at [facility], [address] [place], [place] [address]    Special Requests   Final    BOTTLES DRAWN AEROBIC AND ANAEROBIC Blood Culture adequate volume Performed at [facility], [address] [place], [place] [address]    Culture   Final    NO GROWTH 3 DAYS Performed at [facility], [address], [place], [place] [address]    Report Status PENDING  Incomplete  Blood Culture (routine x 2)     Status: None (Preliminary result)   Collection Time: 10/09/19  1:40 PM   Specimen: BLOOD  Result Value Ref Range Status   Specimen Description   Final    BLOOD RIGHT ANTECUBITAL Performed at [facility], [address] [place], [place] [address]    Special Requests   Final    BOTTLES DRAWN AEROBIC AND ANAEROBIC Blood Culture adequate volume Performed at [facility], [address] [place], [place] [address]    Culture   Final    NO GROWTH 3 DAYS Performed at [facility], [address], [place], [place] [address]    Report Status PENDING  Incomplete         Radiology Studies: No results found.      Scheduled Meds: . albuterol  2 puff Inhalation Q6H  . vitamin C  500 mg Oral Daily  . dexamethasone (DECADRON) injection  6 mg Intravenous Q24H  . enoxaparin (LOVENOX) injection  0.5 mg/kg Subcutaneous Q24H  . zinc sulfate  220 mg Oral Daily   Continuous Infusions: . remdesivir 100 mg in NS 100 mL 100 mg (10/12/19 0838)     LOS: 3 days    Time spent: 25 mins.More than 50% of that time was spent in counseling and/or coordination of care.      Burnadette Pop, MD [facility] P5/08/2019, 10:31 AM

## 2019-10-12 NOTE — TOC Progression Note (Signed)
Transition of Care [facility]) - Progression Note    Patient Details  Name: Kristen Kline MRN: [id number] Date of Birth: 10-08-1985  Transition of [facility]) CM/SW Contact  Geni Bers, RN Phone Number: 10/12/2019, 3:00 PM  Clinical Narrative:    Pt from home with husband. At present time there are no needs.    Expected Discharge Plan: Home/Self Care Barriers to Discharge: No Barriers Identified  Expected Discharge Plan and Services Expected Discharge Plan: Home/Self Care       Living arrangements for the past 2 months: Single Family Home                                       Social Determinants of Health (SDOH) Interventions    Readmission Risk Interventions No flowsheet data found.

## 2019-10-13 LAB — CBC WITH DIFFERENTIAL/PLATELET
Abs Immature Granulocytes: 0.34 10*3/uL — ABNORMAL HIGH (ref 0.00–0.07)
Basophils Absolute: 0 10*3/uL (ref 0.0–0.1)
Basophils Relative: 0 %
Eosinophils Absolute: 0 10*3/uL (ref 0.0–0.5)
Eosinophils Relative: 0 %
HCT: 30.3 % — ABNORMAL LOW (ref 36.0–46.0)
Hemoglobin: 8.7 g/dL — ABNORMAL LOW (ref 12.0–15.0)
Immature Granulocytes: 6 %
Lymphocytes Relative: 27 %
Lymphs Abs: 1.6 10*3/uL (ref 0.7–4.0)
MCH: 21.4 pg — ABNORMAL LOW (ref 26.0–34.0)
MCHC: 28.7 g/dL — ABNORMAL LOW (ref 30.0–36.0)
MCV: 74.6 fL — ABNORMAL LOW (ref 80.0–100.0)
Monocytes Absolute: 0.5 10*3/uL (ref 0.1–1.0)
Monocytes Relative: 9 %
Neutro Abs: 3.4 10*3/uL (ref 1.7–7.7)
Neutrophils Relative %: 58 %
Platelets: 427 10*3/uL — ABNORMAL HIGH (ref 150–400)
RBC: 4.06 MIL/uL (ref 3.87–5.11)
RDW: 17.5 % — ABNORMAL HIGH (ref 11.5–15.5)
WBC: 5.9 10*3/uL (ref 4.0–10.5)
nRBC: 0 % (ref 0.0–0.2)

## 2019-10-13 LAB — COMPREHENSIVE METABOLIC PANEL
ALT: 69 U/L — ABNORMAL HIGH (ref 0–44)
AST: 81 U/L — ABNORMAL HIGH (ref 15–41)
Albumin: 3.3 g/dL — ABNORMAL LOW (ref 3.5–5.0)
Alkaline Phosphatase: 64 U/L (ref 38–126)
Anion gap: 9 (ref 5–15)
BUN: 17 mg/dL (ref 6–20)
CO2: 24 mmol/L (ref 22–32)
Calcium: 8.5 mg/dL — ABNORMAL LOW (ref 8.9–10.3)
Chloride: 106 mmol/L (ref 98–111)
Creatinine, Ser: 0.81 mg/dL (ref 0.44–1.00)
GFR calc Af Amer: >60 mL/min (ref >60)
GFR calc non Af Amer: >60 mL/min (ref >60)
Glucose, Bld: 136 mg/dL — ABNORMAL HIGH (ref 70–99)
Potassium: 4.3 mmol/L (ref 3.5–5.1)
Sodium: 139 mmol/L (ref 135–145)
Total Bilirubin: 0.5 mg/dL (ref 0.3–1.2)
Total Protein: 7.3 g/dL (ref 6.5–8.1)

## 2019-10-13 LAB — C-REACTIVE PROTEIN: CRP: 0.8 mg/dL (ref <1.0)

## 2019-10-13 LAB — PHOSPHORUS: Phosphorus: 4 mg/dL (ref 2.5–4.6)

## 2019-10-13 LAB — FERRITIN: Ferritin: 60 ng/mL (ref 11–307)

## 2019-10-13 LAB — D-DIMER, QUANTITATIVE: D-Dimer, Quant: 0.36 ug/mL-FEU (ref 0.00–0.50)

## 2019-10-13 LAB — MAGNESIUM: Magnesium: 2.2 mg/dL (ref 1.7–2.4)

## 2019-10-13 MED ORDER — ASCORBIC ACID 500 MG PO TABS: 500.0000 mg | ORAL_TABLET | Freq: Every day | ORAL | 0 refills | Status: DC

## 2019-10-13 MED ORDER — GUAIFENESIN-DM 100-10 MG/5ML PO SYRP: 10.0000 mL | ORAL_SOLUTION | ORAL | 0 refills | Status: DC | PRN

## 2019-10-13 MED ORDER — DEXAMETHASONE 6 MG PO TABS
6.0000 mg | ORAL_TABLET | Freq: Every day | ORAL | 0 refills | Status: AC
Start: 2019-10-13 — End: 2019-10-19

## 2019-10-13 MED ORDER — FERROUS SULFATE 325 (65 FE) MG PO TABS: 325.0000 mg | ORAL_TABLET | Freq: Every day | ORAL | 3 refills | Status: DC

## 2019-10-13 MED ORDER — ZINC SULFATE 220 (50 ZN) MG PO CAPS: 220.0000 mg | ORAL_CAPSULE | Freq: Every day | ORAL | 0 refills | Status: DC

## 2019-10-13 NOTE — Discharge Summary (Signed)
Physician Discharge Summary  WINSTON SOBCZYK QZR:[id number] DOB: 21-Jun-1985 DOA: 10/09/2019  PCP: Patient, No Pcp Per  Admit date: 10/09/2019 Discharge date: 10/13/2019  Admitted From: Home Disposition:  Home  Discharge Condition:Stable CODE STATUS:FULL Diet recommendation: Regular  Brief/Interim Summary:  Kristen Kline a 34 y.o.femalewith medical history significant formorbid obesity who presented to med [facility] ED on 10/09/2019 for evaluation of body aches, fever to 102, diarrhea, shortness of breath, and cough  with  sputum. She had decreased oral intake and associated fatigue with generalized weakness.She was noted to have a positive COVID-19 test on 4/26/2021and says her husband also had a positive COVID-19 infection. Chest x-ray on presentation showed hazy opacity on the right side.  Started on remdesivir and Decadron.  Currently on room air.  Inflammatory markers significantly improved.  She completed 5 days course of remdesivir.  Following problems were addressed during her hospitalization:   Acute hypoxemic respiratory failure due to COVID-19 pneumonia: Positive SARS-CoV-2 test on 10/05/2019. Hazy right-sided opacificationsseen on chest x-ray.  She was started on IV remdesivir at [facility].  She was hypoxic on presentation requiring oxygen supplementation.  This morning she was on room air. She finished 5 days course of remdesivir.  She will be discharged on oral Decadron Continue vitamin C, zinc, antitussives Inflammatory markers significantly improved.  Microcytic anemia: Iron studies showed low iron. She reported menorrhagia. S/P  a dose of IV iron.  Started on iron  supplementation  on discharge.   Morbid Obesity: BMI of 58.1   Discharge Diagnoses:  Principal Problem:   Acute hypoxemic respiratory failure due to COVID-19 [facility]) Active Problems:   Microcytic anemia    Discharge Instructions  Discharge Instructions    Diet general    Complete by: As directed    Discharge instructions   Complete by: As directed    1)Take prescribed medications as instructed. 2) Isolate yourself till 15 May.  Dont share the kitchen,bathroom,utensils with other healthy family members till that date. 3)Infection Prevention Recommendations for Individuals Confirmed to have, or Being Evaluated for, 2019 Novel Coronavirus (COVID-19) Infection Who Receive Care at Home  Individuals who are confirmed to have, or are being evaluated for, COVID-19 should follow the prevention steps below until a healthcare provider or local or state health department says they can return to normal activities.  Stay home except to get medical care You should restrict activities outside your home, except for getting medical care. Do not go to work, school, or public areas, and do not use public transportation or taxis.  Call ahead before visiting your doctor Before your medical appointment, call the healthcare provider and tell them that you have, or are being evaluated for, COVID-19 infection. This will help the healthcare provider's office take steps to keep other people from getting infected. Ask your healthcare provider to call the local or state health department.  Monitor your symptoms Seek prompt medical attention if your illness is worsening (e.g., difficulty breathing). Before going to your medical appointment, call the healthcare provider and tell them that you have, or are being evaluated for, COVID-19 infection. Ask your healthcare provider to call the local or state health department.  Wear a facemask You should wear a facemask that covers your nose and mouth when you are in the same room with other people and when you visit a healthcare provider. People who live with or visit you should also wear a facemask while they are in the same room with you.  Separate  yourself from other people in your home As much as possible, you should stay in a  different room from other people in your home. Also, you should use a separate bathroom, if available.  Avoid sharing household items You should not share dishes, drinking glasses, cups, eating utensils, towels, bedding, or other items with other people in your home. After using these items, you should wash them thoroughly with soap and water.  Cover your coughs and sneezes Cover your mouth and nose with a tissue when you cough or sneeze, or you can cough or sneeze into your sleeve. Throw used tissues in a lined trash can, and immediately wash your hands with soap and water for at least 20 seconds or use an alcohol-based hand rub.  Wash your [employer] your hands often and thoroughly with soap and water for at least 20 seconds. You can use an alcohol-based hand sanitizer if soap and water are not available and if your hands are not visibly dirty. Avoid touching your eyes, nose, and mouth with unwashed hands.   Prevention Steps for Caregivers and Household Members of Individuals Confirmed to have, or Being Evaluated for, COVID-19 Infection Being Cared for in the Home  If you live with, or provide care at home for, a person confirmed to have, or being evaluated for, COVID-19 infection please follow these guidelines to prevent infection:  Follow healthcare provider's instructions Make sure that you understand and can help the patient follow any healthcare provider instructions for all care.  Provide for the patient's basic needs You should help the patient with basic needs in the home and provide support for getting groceries, prescriptions, and other personal needs.  Monitor the patient's symptoms If they are getting sicker, call his or her medical provider and tell them that the patient has, or is being evaluated for, COVID-19 infection. This will help the healthcare provider's office take steps to keep other people from getting infected. Ask the healthcare provider to call  the local or state health department.  Limit the number of people who have contact with the patient If possible, have only one caregiver for the patient. Other household members should stay in another home or place of residence. If this is not possible, they should stay in another room, or be separated from the patient as much as possible. Use a separate bathroom, if available. Restrict visitors who do not have an essential need to be in the home.  Keep older adults, very young children, and other sick people away from the patient Keep older adults, very young children, and those who have compromised immune systems or chronic health conditions away from the patient. This includes people with chronic heart, lung, or kidney conditions, diabetes, and cancer.  Ensure good ventilation Make sure that shared spaces in the home have good air flow, such as from an air conditioner or an opened window, weather permitting.  Wash your hands often Wash your hands often and thoroughly with soap and water for at least 20 seconds. You can use an alcohol based hand sanitizer if soap and water are not available and if your hands are not visibly dirty. Avoid touching your eyes, nose, and mouth with unwashed hands. Use disposable paper towels to dry your hands. If not available, use dedicated cloth towels and replace them when they become wet.  Wear a facemask and gloves Wear a disposable facemask at all times in the room and gloves when you touch or have contact with the patient's blood,  body fluids, and/or secretions or excretions, such as sweat, saliva, sputum, nasal mucus, vomit, urine, or feces. Ensure the mask fits over your nose and mouth tightly, and do not touch it during use. Throw out disposable facemasks and gloves after using them. Do not reuse. Wash your hands immediately after removing your facemask and gloves. If your personal clothing becomes contaminated, carefully remove clothing and  launder. Wash your hands after handling contaminated clothing. Place all used disposable facemasks, gloves, and other waste in a lined container before disposing them with other household waste. Remove gloves and wash your hands immediately after handling these items.  Do not share dishes, glasses, or other household items with the patient Avoid sharing household items. You should not share dishes, drinking glasses, cups, eating utensils, towels, bedding, or other items with a patient who is confirmed to have, or being evaluated for, COVID-19 infection. After the person uses these items, you should wash them thoroughly with soap and water.  Wash laundry thoroughly Immediately remove and wash clothes or bedding that have blood, body fluids, and/or secretions or excretions, such as sweat, saliva, sputum, nasal mucus, vomit, urine, or feces, on them. Wear gloves when handling laundry from the patient. Read and follow directions on labels of laundry or clothing items and detergent. In general, wash and dry with the warmest temperatures recommended on the label.  Clean all areas the individual has used often Clean all touchable surfaces, such as counters, tabletops, doorknobs, bathroom fixtures, toilets, phones, keyboards, tablets, and bedside tables, every day. Also, clean any surfaces that may have blood, body fluids, and/or secretions or excretions on them. Wear gloves when cleaning surfaces the patient has come in contact with. Use a diluted bleach solution (e.g., dilute bleach with 1 part bleach and 10 parts water) or a household disinfectant with a label that says EPA-registered for coronaviruses. To make a bleach solution at home, add 1 tablespoon of bleach to 1 quart (4 cups) of water. For a larger supply, add  cup of bleach to 1 gallon (16 cups) of water. Read labels of cleaning products and follow recommendations provided on product labels. Labels contain instructions for safe and effective  use of the cleaning product including precautions you should take when applying the product, such as wearing gloves or eye protection and making sure you have good ventilation during use of the product. Remove gloves and wash hands immediately after cleaning.  Monitor yourself for signs and symptoms of illness Caregivers and household members are considered close contacts, should monitor their health, and will be asked to limit movement outside of the home to the extent possible. Follow the monitoring steps for close contacts listed on the symptom monitoring form.    Increase activity slowly   Complete by: As directed      Allergies as of 10/13/2019   No Known Allergies     Medication List    STOP taking these medications   cephALEXin 500 MG capsule Commonly known as: Keflex   ibuprofen 600 MG tablet Commonly known as: ADVIL     TAKE these medications   ascorbic acid 500 MG tablet Commonly known as: VITAMIN C Take 1 tablet (500 mg total) by mouth daily. Start taking on: Oct 14, 2019   dexamethasone 6 MG tablet Commonly known as: DECADRON Take 1 tablet (6 mg total) by mouth daily for 6 days.   ferrous sulfate 325 (65 FE) MG tablet Take 1 tablet (325 mg total) by mouth daily with breakfast. Start  taking on: Oct 14, 2019   guaiFENesin-dextromethorphan 100-10 MG/5ML syrup Commonly known as: ROBITUSSIN DM Take 10 mLs by mouth every 4 (four) hours as needed for cough.   multivitamin tablet Take 1 tablet by mouth daily.   zinc sulfate 220 (50 Zn) MG capsule Take 1 capsule (220 mg total) by mouth daily. Start taking on: Oct 14, 2019       No Known Allergies  Consultations:  None   Procedures/Studies: DG Chest Port 1 View  Result Date: 10/09/2019 CLINICAL DATA:  Shortness of breath.  COVID-19 positive. EXAM: PORTABLE CHEST 1 VIEW COMPARISON:  05/29/2010 FINDINGS: Lungs are hypoinflated with hazy opacification over the right perihilar region and right lung base  which could be seen due to asymmetric edema versus infection. No evidence of effusion. Cardiomediastinal silhouette and remainder of the exam is unchanged. IMPRESSION: Hazy opacification over the right perihilar region and right base which may be due to asymmetric vascular congestion versus infection. Electronically Signed   By: Marin Olp M.D.   On: 10/09/2019 13:29       Subjective: Patient seen and examined at the bedside this morning.  Hemodynamically stable for discharge today.  Discharge Exam: Vitals:   10/12/19 2013 10/13/19 0524  BP: 106/78 126/69  Pulse: 79 (!) 58  Resp: 20 18  Temp: 98.5 F (36.9 C) 97.9 F (36.6 C)  SpO2: 98% 95%   Vitals:   10/12/19 0525 10/12/19 1332 10/12/19 2013 10/13/19 0524  BP: 112/60 125/79 106/78 126/69  Pulse: 68 80 79 (!) 58  Resp: 15 18 20 18   Temp: 97.6 F (36.4 C) 98.4 F (36.9 C) 98.5 F (36.9 C) 97.9 F (36.6 C)  TempSrc: Oral Oral Oral Oral  SpO2: 97% 97% 98% 95%  Weight:      Height:        General: Pt is alert, awake, not in acute distress Cardiovascular: RRR, S1/S2 +, no rubs, no gallops Respiratory: CTA bilaterally, no wheezing, no rhonchi Abdominal: Soft, NT, ND, bowel sounds + Extremities: no edema, no cyanosis    The results of significant diagnostics from this hospitalization (including imaging, microbiology, ancillary and laboratory) are listed below for reference.     Microbiology: Recent Results (from the past 240 hour(s))  Novel Coronavirus, NAA ([facility])     Status: Abnormal   Collection Time: 10/05/19 11:19 AM   Specimen: Nasopharyngeal(NP) swabs in vial transport medium   NASOPHARYNGE  TESTING  Result Value Ref Range Status   SARS-CoV-2, NAA Detected (A) Not Detected Final    Comment: Patients who have a positive COVID-19 test result may now have treatment options. Treatment options are available for patients with mild to moderate symptoms and for hospitalized patients. Visit our website at  [email or web address] for resources and information. This nucleic acid amplification test was developed and its performance characteristics determined by [employer]. Nucleic acid amplification tests include RT-PCR and TMA. This test has not been FDA cleared or approved. This test has been authorized by FDA under an Emergency Use Authorization (EUA). This test is only authorized for the duration of time the declaration that circumstances exist justifying the authorization of the emergency use of in vitro diagnostic tests for detection of SARS-CoV-2 virus and/or diagnosis of COVID-19 infection under section 564(b)(1) of the Act, 21 U.S.C. 295MWU-1(L) (1), unless the authorization is terminated or revoked sooner. When diagnostic testing is negativ e, the possibility of a false negative result should be considered in the context of a patient's recent exposures and  the presence of clinical signs and symptoms consistent with COVID-19. An individual without symptoms of COVID-19 and who is not shedding SARS-CoV-2 virus would expect to have a negative (not detected) result in this assay.   SARS-COV-2, NAA 2 DAY TAT     Status: None   Collection Time: 10/05/19 11:19 AM   NASOPHARYNGE  TESTING  Result Value Ref Range Status   SARS-CoV-2, NAA 2 DAY TAT Performed  Final  Blood Culture (routine x 2)     Status: None (Preliminary result)   Collection Time: 10/09/19  1:25 PM   Specimen: BLOOD  Result Value Ref Range Status   Specimen Description   Final    BLOOD LEFT ANTECUBITAL Performed at [facility], [address] [place], [place] [address]    Special Requests   Final    BOTTLES DRAWN AEROBIC AND ANAEROBIC Blood Culture adequate volume Performed at [facility], [address], [place], [place] [address]    Culture   Final    NO GROWTH 4 DAYS Performed at [facility], [address] [place], [place] [address]    Report Status PENDING   Incomplete  Blood Culture (routine x 2)     Status: None (Preliminary result)   Collection Time: 10/09/19  1:40 PM   Specimen: BLOOD  Result Value Ref Range Status   Specimen Description   Final    BLOOD RIGHT ANTECUBITAL Performed at [facility], [address] [place], [place] [address]    Special Requests   Final    BOTTLES DRAWN AEROBIC AND ANAEROBIC Blood Culture adequate volume Performed at [facility], [address] [place], [place] [address]    Culture   Final    NO GROWTH 4 DAYS Performed at [facility], [address], [place], [place] [address]    Report Status PENDING  Incomplete     Labs: BNP (last 3 results) Recent Labs    10/11/19 0521  BNP 7.5   Basic Metabolic Panel: Recent Labs  Lab 10/09/19 1314 10/11/19 0521 10/12/19 0501 10/13/19 0401  NA 136 139 139 139  K 3.8 4.1 4.3 4.3  CL 104 108 107 106  CO2 22 24 24 24   GLUCOSE 121* 152* 143* 136*  BUN 8 13 16 17   CREATININE 0.81 0.78 0.76 0.81  CALCIUM 8.3* 8.5* 8.6* 8.5*  MG  --   --  2.3 2.2  PHOS  --   --  4.5 4.0   Liver Function Tests: Recent Labs  Lab 10/09/19 1314 10/11/19 0521 10/12/19 0501 10/13/19 0401  AST 70* 89* 91* 81*  ALT 32 48* 59* 69*  ALKPHOS 57 54 54 64  BILITOT 0.2* 0.4 0.3 0.5  PROT 7.9 7.6 7.5 7.3  ALBUMIN 3.5 3.3* 3.3* 3.3*   No results for input(s): LIPASE, AMYLASE in the last 168 hours. No results for input(s): AMMONIA in the last 168 hours. CBC: Recent Labs  Lab 10/09/19 1314 10/11/19 0521 10/12/19 0501 10/13/19 0401  WBC 3.3* 2.8* 4.0 5.9  NEUTROABS 1.9 1.6* 2.3 3.4  HGB 9.5* 8.6* 8.5* 8.7*  HCT 31.2* 29.9* 29.6* 30.3*  MCV 70.1* 74.0* 73.8* 74.6*  PLT 374 358 412* 427*   Cardiac Enzymes: No results for input(s): CKTOTAL, CKMB, CKMBINDEX, TROPONINI in the last 168 hours. BNP: Invalid input(s): POCBNP CBG: No results for input(s): GLUCAP in the last 168 hours. D-Dimer Recent Labs  10/12/19 0501 10/13/19 0401   DDIMER 0.37 0.36   Hgb A1c No results for input(s): HGBA1C in the last 72 hours. Lipid Profile No results for input(s): CHOL, HDL, LDLCALC, TRIG, CHOLHDL, LDLDIRECT in the last 72 hours. Thyroid function studies No results for input(s): TSH, T4TOTAL, T3FREE, THYROIDAB in the last 72 hours.  Invalid input(s): FREET3 Anemia work up [employer]    10/11/19 0521 10/11/19 0521 10/12/19 0501 10/13/19 0401  FERRITIN 88   < > 66 60  TIBC 346  --   --   --   IRON 17*  --   --   --    < > = values in this interval not displayed.   Urinalysis    Component Value Date/Time   COLORURINE YELLOW 07/22/2018 0154   APPEARANCEUR CLOUDY (A) 07/22/2018 0154   LABSPEC 1.020 07/22/2018 0154   PHURINE 6.0 07/22/2018 0154   GLUCOSEU NEGATIVE 07/22/2018 0154   HGBUR LARGE (A) 07/22/2018 0154   BILIRUBINUR NEGATIVE 07/22/2018 0154   KETONESUR NEGATIVE 07/22/2018 0154   PROTEINUR NEGATIVE 07/22/2018 0154   UROBILINOGEN 0.2 04/13/2010 0324   NITRITE NEGATIVE 07/22/2018 0154   LEUKOCYTESUR SMALL (A) 07/22/2018 0154   Sepsis Labs Invalid input(s): PROCALCITONIN,  WBC,  LACTICIDVEN Microbiology Recent Results (from the past 240 hour(s))  Novel Coronavirus, NAA ([facility])     Status: Abnormal   Collection Time: 10/05/19 11:19 AM   Specimen: Nasopharyngeal(NP) swabs in vial transport medium   NASOPHARYNGE  TESTING  Result Value Ref Range Status   SARS-CoV-2, NAA Detected (A) Not Detected Final    Comment: Patients who have a positive COVID-19 test result may now have treatment options. Treatment options are available for patients with mild to moderate symptoms and for hospitalized patients. Visit our website at [email or web address] for resources and information. This nucleic acid amplification test was developed and its performance characteristics determined by [employer]. Nucleic acid amplification tests include RT-PCR and TMA. This test has not been FDA cleared or  approved. This test has been authorized by FDA under an Emergency Use Authorization (EUA). This test is only authorized for the duration of time the declaration that circumstances exist justifying the authorization of the emergency use of in vitro diagnostic tests for detection of SARS-CoV-2 virus and/or diagnosis of COVID-19 infection under section 564(b)(1) of the Act, 21 U.S.C. 161WRU-0(A) (1), unless the authorization is terminated or revoked sooner. When diagnostic testing is negativ e, the possibility of a false negative result should be considered in the context of a patient's recent exposures and the presence of clinical signs and symptoms consistent with COVID-19. An individual without symptoms of COVID-19 and who is not shedding SARS-CoV-2 virus would expect to have a negative (not detected) result in this assay.   SARS-COV-2, NAA 2 DAY TAT     Status: None   Collection Time: 10/05/19 11:19 AM   NASOPHARYNGE  TESTING  Result Value Ref Range Status   SARS-CoV-2, NAA 2 DAY TAT Performed  Final  Blood Culture (routine x 2)     Status: None (Preliminary result)   Collection Time: 10/09/19  1:25 PM   Specimen: BLOOD  Result Value Ref Range Status   Specimen Description   Final    BLOOD LEFT ANTECUBITAL Performed at [facility], [address] [place], [place] [address]    Special Requests   Final    BOTTLES DRAWN AEROBIC AND ANAEROBIC Blood Culture adequate volume Performed at [facility], [address] [place], [place] [address]    Culture   Final    NO GROWTH 4 DAYS Performed at [facility], [address] [place], [place] [address]    Report Status PENDING  Incomplete  Blood Culture (routine x 2)     Status: None (Preliminary result)   Collection Time: 10/09/19  1:40 PM   Specimen: BLOOD  Result Value Ref Range Status   Specimen Description   Final    BLOOD RIGHT ANTECUBITAL Performed at [facility] [place], [address] [place], [place] [address]    Special Requests   Final    BOTTLES DRAWN AEROBIC AND ANAEROBIC Blood Culture adequate volume Performed at [facility] [place], [address] [place], [place] [address]    Culture   Final    NO GROWTH 4 DAYS Performed at [facility], [address] [place], [place] [address]    Report Status PENDING  Incomplete    Please note: You were cared for by a hospitalist during your hospital stay. Once you are discharged, your primary care physician will handle any further medical issues. Please note that NO REFILLS for any discharge medications will be authorized once you are discharged, as it is imperative that you return to your primary care physician (or establish a relationship with a primary care physician if you do not have one) for your post hospital discharge needs so that they can reassess your need for medications and monitor your lab values.    Time coordinating discharge: 40 minutes  SIGNED:   Burnadette PopAmrit Ferdinand Revoir, MD  [facility] 10/13/2019, 10:36 AM Pager [phone]  If 7PM-7AM, please contact night-coverage [email or web address] Password [email or web address]

## 2019-10-14 ENCOUNTER — Encounter (INDEPENDENT_AMBULATORY_CARE_PROVIDER_SITE_OTHER): Payer: Self-pay

## 2019-10-14 LAB — CULTURE, BLOOD (ROUTINE X 2)
Culture: NO GROWTH
Culture: NO GROWTH
Special Requests: ADEQUATE
Special Requests: ADEQUATE

## 2019-10-15 ENCOUNTER — Encounter (INDEPENDENT_AMBULATORY_CARE_PROVIDER_SITE_OTHER): Payer: Self-pay

## 2019-10-17 ENCOUNTER — Encounter (INDEPENDENT_AMBULATORY_CARE_PROVIDER_SITE_OTHER): Payer: Self-pay

## 2019-10-18 ENCOUNTER — Encounter (INDEPENDENT_AMBULATORY_CARE_PROVIDER_SITE_OTHER): Payer: Self-pay

## 2019-10-20 ENCOUNTER — Encounter (INDEPENDENT_AMBULATORY_CARE_PROVIDER_SITE_OTHER): Payer: Self-pay

## 2019-10-22 ENCOUNTER — Encounter (INDEPENDENT_AMBULATORY_CARE_PROVIDER_SITE_OTHER): Payer: Self-pay

## 2019-10-22 ENCOUNTER — Telehealth: Payer: Self-pay | Admitting: *Deleted

## 2019-10-22 NOTE — Telephone Encounter (Signed)
Call to patient in response to COVID MyChart- companion response for weakness- worse. Patient states she has an episode of weakness last night- she is much better this morning. Patient states she is hydrating, eating and doe not have respiratory symptoms. Patient advised protocol for weakness- advised to go to ED for severe weakness where she needs help to walk or has to hold on to walk. Patient also advised to continue her good eating and hydrating habits, monitor temperature and report changes. Advised to get PCP- this wood be good time to establish care. Patient agrees with this suggestion and will continue her survey.

## 2019-10-23 ENCOUNTER — Encounter (INDEPENDENT_AMBULATORY_CARE_PROVIDER_SITE_OTHER): Payer: Self-pay

## 2019-10-24 ENCOUNTER — Encounter (INDEPENDENT_AMBULATORY_CARE_PROVIDER_SITE_OTHER): Payer: Self-pay

## 2019-10-25 ENCOUNTER — Encounter (INDEPENDENT_AMBULATORY_CARE_PROVIDER_SITE_OTHER): Payer: Self-pay

## 2019-11-06 ENCOUNTER — Emergency Department (HOSPITAL_BASED_OUTPATIENT_CLINIC_OR_DEPARTMENT_OTHER): Payer: Medicaid Other

## 2019-11-06 ENCOUNTER — Emergency Department (HOSPITAL_BASED_OUTPATIENT_CLINIC_OR_DEPARTMENT_OTHER)
Admission: EM | Admit: 2019-11-06 | Discharge: 2019-11-06 | Disposition: A | Discharge status: Home / Self Care | Payer: Medicaid Other | Attending: Emergency Medicine | Admitting: Emergency Medicine

## 2019-11-06 ENCOUNTER — Encounter (HOSPITAL_BASED_OUTPATIENT_CLINIC_OR_DEPARTMENT_OTHER): Payer: Self-pay | Admitting: *Deleted

## 2019-11-06 ENCOUNTER — Other Ambulatory Visit: Payer: Self-pay

## 2019-11-06 DIAGNOSIS — Z87891 Personal history of nicotine dependence: Secondary | ICD-10-CM | POA: Insufficient documentation

## 2019-11-06 DIAGNOSIS — R911 Solitary pulmonary nodule: Secondary | ICD-10-CM | POA: Diagnosis not present

## 2019-11-06 DIAGNOSIS — R0789 Other chest pain: Secondary | ICD-10-CM | POA: Insufficient documentation

## 2019-11-06 DIAGNOSIS — R0602 Shortness of breath: Secondary | ICD-10-CM | POA: Diagnosis not present

## 2019-11-06 DIAGNOSIS — Z79899 Other long term (current) drug therapy: Secondary | ICD-10-CM | POA: Insufficient documentation

## 2019-11-06 DIAGNOSIS — R7989 Other specified abnormal findings of blood chemistry: Secondary | ICD-10-CM | POA: Diagnosis not present

## 2019-11-06 DIAGNOSIS — Z8616 Personal history of COVID-19: Secondary | ICD-10-CM | POA: Diagnosis not present

## 2019-11-06 LAB — CBC WITH DIFFERENTIAL/PLATELET
Abs Immature Granulocytes: 0.03 K/uL (ref 0.00–0.07)
Basophils Absolute: 0 K/uL (ref 0.0–0.1)
Basophils Relative: 1 %
Eosinophils Absolute: 0.2 K/uL (ref 0.0–0.5)
Eosinophils Relative: 3 %
HCT: 34.8 % — ABNORMAL LOW (ref 36.0–46.0)
Hemoglobin: 11.2 g/dL — ABNORMAL LOW (ref 12.0–15.0)
Immature Granulocytes: 0 %
Lymphocytes Relative: 32 %
Lymphs Abs: 2.8 K/uL (ref 0.7–4.0)
MCH: 25.3 pg — ABNORMAL LOW (ref 26.0–34.0)
MCHC: 32.2 g/dL (ref 30.0–36.0)
MCV: 78.7 fL — ABNORMAL LOW (ref 80.0–100.0)
Monocytes Absolute: 0.4 K/uL (ref 0.1–1.0)
Monocytes Relative: 4 %
Neutro Abs: 5.2 K/uL (ref 1.7–7.7)
Neutrophils Relative %: 60 %
Platelets: 435 K/uL — ABNORMAL HIGH (ref 150–400)
RBC: 4.42 MIL/uL (ref 3.87–5.11)
RDW: 23.4 % — ABNORMAL HIGH (ref 11.5–15.5)
WBC: 8.7 K/uL (ref 4.0–10.5)
nRBC: 0 % (ref 0.0–0.2)

## 2019-11-06 LAB — BASIC METABOLIC PANEL WITH GFR
Anion gap: 11 (ref 5–15)
BUN: 6 mg/dL (ref 6–20)
CO2: 22 mmol/L (ref 22–32)
Calcium: 8.9 mg/dL (ref 8.9–10.3)
Chloride: 104 mmol/L (ref 98–111)
Creatinine, Ser: 0.7 mg/dL (ref 0.44–1.00)
GFR calc Af Amer: >60 mL/min
GFR calc non Af Amer: >60 mL/min
Glucose, Bld: 94 mg/dL (ref 70–99)
Potassium: 3.8 mmol/L (ref 3.5–5.1)
Sodium: 137 mmol/L (ref 135–145)

## 2019-11-06 LAB — D-DIMER, QUANTITATIVE: D-Dimer, Quant: 0.53 ug/mL-FEU — ABNORMAL HIGH (ref 0.00–0.50)

## 2019-11-06 MED ORDER — ALBUTEROL SULFATE HFA 108 (90 BASE) MCG/ACT IN AERS
2.0000 | INHALATION_SPRAY | RESPIRATORY_TRACT | 0 refills | Status: DC | PRN
Start: 2019-11-06 — End: 2020-01-25

## 2019-11-06 MED ORDER — IOHEXOL 350 MG/ML SOLN
100.0000 mL | Freq: Once | INTRAVENOUS | Status: AC | PRN
  Administered 2019-11-06: 77 mL via INTRAVENOUS

## 2019-11-06 NOTE — Discharge Instructions (Signed)
Continue using your albuterol inhaler as needed, I have sent in a refill of this for you.  Your work-up today was largely reassuring.  There were some limitations to your CT scan, if you develop any worsening shortness of breath despite using your inhaler, chest pain, lower extremity swelling, feel lightheaded or like you are going to pass out or develop any other concerning symptoms return to the ED.  Your CT scan showed 2 small pulmonary nodules, these should be followed up in 3 to 6 months with another CT scan to monitor for any changes.  Call to schedule follow-up appointment with primary care doctor regarding this.

## 2019-11-06 NOTE — ED Triage Notes (Signed)
Covid positive a month ago. Difficulty getting a deep breath x 4 days. She stopped using her inhaler but used it yesterday with relief.

## 2019-11-06 NOTE — ED Provider Notes (Signed)
[facility] EMERGENCY DEPARTMENT Provider Note   CSN: [id number] Arrival date & time: 11/06/19  1239     History Chief Complaint  Patient presents with  . Shortness of Breath    Kristen Kline is a 34 y.o. female.  Kristen Kline is a 34 y.o. female with a history of obesity, anxiety, and COVID-19 infection requiring 3-day hospital admission at the beginning of May, who presents to the ED today for 4 days of worsening shortness of breath.  Patient states that after she was discharged from the hospital she completed the course of steroid she was prescribed and after finishing those medicines noticed a bit of shortness of breath that then seemed to resolve.  She had continue to use her inhaler, but stopped using this about a week ago over the past 4 days she has noticed increasing shortness of breath, feeling like she cannot take a deep breath and that her chest feels a bit tight.  She states that yesterday she started using her inhaler again with some improvement, but got concerned he does not have a doctor that she regularly follows up with.  She denies associated persistent chest pain.  No lower extremity swelling or pain.  No associated fevers or cough.  No abdominal pain, nausea or vomiting.  No prior history of asthma or chronic lung disease.  She is not currently on OCPs, no recent long distance travel or surgery, no history of PE or DVT.        Past Medical History:  Diagnosis Date  . Anxiety   . Hx of cholecystectomy   . Morbid obesity [facility])     Patient Active Problem List   Diagnosis Date Noted  . Microcytic anemia 10/10/2019  . Acute hypoxemic respiratory failure due to COVID-19 (HCC) 10/09/2019  . Morbid obesity (HCC)   . UTI (lower urinary tract infection) 05/30/2015  . Morbid obesity with BMI of 50.0-59.9, adult (HCC) 05/30/2015    Past Surgical History:  Procedure Laterality Date  . CESAREAN SECTION    . CHOLECYSTECTOMY    . WISDOM TOOTH  EXTRACTION       OB History    Gravida  2   Para  2   Term  2   Preterm      AB      Living  2     SAB      TAB      Ectopic      Multiple      Live Births              Family History  Problem Relation Age of Onset  . Heart failure Mother   . Diabetes Mother   . Hypertension Father   . Stroke Paternal Grandfather   . Stroke Paternal Grandmother     Social History   Tobacco Use  . Smoking status: Former Games developer  . Smokeless tobacco: Never Used  Substance Use Topics  . Alcohol use: No  . Drug use: No    Home Medications Prior to Admission medications   Medication Sig Start Date End Date Taking? Authorizing Provider  ascorbic acid (VITAMIN C) 500 MG tablet Take 1 tablet (500 mg total) by mouth daily. 10/14/19  Yes Burnadette Pop, MD  ferrous sulfate 325 (65 FE) MG tablet Take 1 tablet (325 mg total) by mouth daily with breakfast. 10/14/19  Yes Adhikari, Amrit, MD  guaiFENesin-dextromethorphan (ROBITUSSIN DM) 100-10 MG/5ML syrup Take 10 mLs by mouth every 4 (four)  hours as needed for cough. 10/13/19  Yes Burnadette Pop, MD  zinc sulfate 220 (50 Zn) MG capsule Take 1 capsule (220 mg total) by mouth daily. 10/14/19  Yes Burnadette Pop, MD  Multiple Vitamin (MULTIVITAMIN) tablet Take 1 tablet by mouth daily.    [provider]    Allergies    Patient has no known allergies.  Review of Systems   Review of Systems  Constitutional: Negative for chills and fever.  HENT: Negative for congestion, rhinorrhea and sore throat.   Respiratory: Positive for cough, chest tightness and shortness of breath.   Cardiovascular: Negative for chest pain, palpitations and leg swelling.  Gastrointestinal: Negative for abdominal pain, nausea and vomiting.  Musculoskeletal: Negative for arthralgias and myalgias.  Skin: Negative for color change and rash.  Neurological: Negative for dizziness, syncope and light-headedness.  All other systems reviewed and are  negative.   Physical Exam Updated Vital Signs BP (!) 106/57   Pulse 98   Temp 98.7 F (37.1 C) (Oral)   Resp 16   Ht 5\' 6"  (1.676 m)   Wt (!) 163.3 kg   LMP 10/09/2019   SpO2 100%   BMI 58.11 kg/m   Physical Exam Vitals and nursing note reviewed.  Constitutional:      General: She is not in acute distress.    Appearance: She is well-developed. She is obese. She is not ill-appearing or diaphoretic.  HENT:     Head: Normocephalic and atraumatic.  Eyes:     General:        Right eye: No discharge.        Left eye: No discharge.  Cardiovascular:     Rate and Rhythm: Normal rate and regular rhythm.     Heart sounds: Normal heart sounds. No murmur. No friction rub. No gallop.   Pulmonary:     Effort: Pulmonary effort is normal. No respiratory distress.     Breath sounds: Normal breath sounds. No wheezing or rales.     Comments: Respirations equal and unlabored, patient able to speak in full sentences, lungs clear to auscultation bilaterally Abdominal:     General: Bowel sounds are normal. There is no distension.     Palpations: Abdomen is soft. There is no mass.     Tenderness: There is no abdominal tenderness. There is no guarding.     Comments: Abdomen soft, nondistended, nontender to palpation in all quadrants without guarding or peritoneal signs  Musculoskeletal:        General: No deformity.     Cervical back: Neck supple.     Right lower leg: No tenderness. No edema.     Left lower leg: No tenderness. No edema.     Comments: Bilateral lower extremities warm and well perfused without edema or tenderness  Skin:    General: Skin is warm and dry.     Capillary Refill: Capillary refill takes less than 2 seconds.  Neurological:     Mental Status: She is alert.     Coordination: Coordination normal.     Comments: Speech is clear, able to follow commands Moves extremities without ataxia, coordination intact  Psychiatric:        Mood and Affect: Mood normal.         Behavior: Behavior normal.     ED Results / Procedures / Treatments   Labs (all labs ordered are listed, but only abnormal results are displayed) Labs Reviewed  CBC WITH DIFFERENTIAL/PLATELET - Abnormal; Notable for the following components:  Result Value   Hemoglobin 11.2 (*)    HCT 34.8 (*)    MCV 78.7 (*)    MCH 25.3 (*)    RDW 23.4 (*)    Platelets 435 (*)    All other components within normal limits  D-DIMER, QUANTITATIVE (NOT AT [facility]) - Abnormal; Notable for the following components:   D-Dimer, Quant 0.53 (*)    All other components within normal limits  BASIC METABOLIC PANEL    EKG EKG Interpretation  Date/Time:  Friday Nov 06 2019 15:53:58 EDT Ventricular Rate:  94 PR Interval:  134 QRS Duration: 74 QT Interval:  360 QTC Calculation: 450 R Axis:   60 Text Interpretation: Normal sinus rhythm Normal ECG Rate slower since prior 4/21 Confirmed by Aletta Edouard [phone]) on 11/06/2019 4:00:38 PM   Radiology DG Chest Port 1 View  Result Date: 11/06/2019 CLINICAL DATA:  Shortness of breath.  COVID-19 1 month ago. EXAM: PORTABLE CHEST 1 VIEW COMPARISON:  10/09/2019 FINDINGS: The heart size and pulmonary vascularity are normal and the lungs are now clear. No bone abnormality. IMPRESSION: Normal chest. Electronically Signed   By: Lorriane Shire M.D.   On: 11/06/2019 16:05    Procedures Procedures (including critical care time)  Medications Ordered in ED Medications  iohexol (OMNIPAQUE) 350 MG/ML injection 100 mL (77 mLs Intravenous Contrast Given 11/06/19 1709)    ED Course  I have reviewed the triage vital signs and the nursing notes.  Pertinent labs & imaging results that were available during my care of the patient were reviewed by me and considered in my medical decision making (see chart for details).    MDM Rules/Calculators/A&P                      34 year old female presents with worsening shortness of breath after recent Covid infection, she was  initially improving, but then shortness of breath seem to return.  Has been improving since she began using her albuterol inhaler again, but patient is very concerned.  She does not have someone that she regularly follows up with to check in on this.  Aside from recent Covid infection she does not have other risk factors for blood clot, not on OCPs, she has no clinical signs of DVT, and no recent travel or surgery.  I had a shared decision making discussion with the patient and she would like to be evaluated for blood clot or other potential cause for her worsening shortness of breath will get basic labs, D-dimer, chest x-ray and EKG.  I have independently ordered, reviewed and interpreted all labs and imaging: EKG: Normal sinus rhythm CXR: No acute cardiopulmonary disease, previous infiltrates noted during Covid admission have resolved CBC: No leukocytosis, significant improvement in hemoglobin since recent admission BMP: No acute electrolyte derangements, normal renal function D-dimer: 0.53, slightly elevated so we will proceed with CT scan  CTA: Evaluation for PE is significantly limited due to respiratory motion artifact, bolus timing and body habitus there is no large central PE noted, 2 pulmonary nodules noted, discussed this with patient and recommend PCP follow-up for further evaluation.  Discussed with patient limited evaluation but given that her symptoms at this time are still resolved with her albuterol inhaler have asked her to monitor symptoms closely if they are worsening she develops chest pain, lower extremity swelling or pain or shortness of breath that is not relieved by inhaler she should return for reevaluation.   At this time there does not appear  to be any evidence of an acute emergency medical condition and the patient appears stable for discharge with appropriate outpatient follow up.Diagnosis was discussed with patient who verbalizes understanding and is agreeable to  discharge.   Final Clinical Impression(s) / ED Diagnoses Final diagnoses:  Shortness of breath  Chest tightness  History of COVID-19  Pulmonary nodule    Rx / DC Orders ED Discharge Orders    None       Legrand Rams 11/06/19 Berna Spare    Tilden Fossa, MD 11/07/19 [phone]

## 2019-11-10 ENCOUNTER — Other Ambulatory Visit: Payer: Self-pay

## 2019-11-10 ENCOUNTER — Ambulatory Visit: Payer: Medicaid Other | Attending: Internal Medicine

## 2019-11-10 ENCOUNTER — Ambulatory Visit: Payer: Self-pay

## 2019-11-10 DIAGNOSIS — Z20822 Contact with and (suspected) exposure to covid-19: Secondary | ICD-10-CM

## 2019-11-10 NOTE — Telephone Encounter (Signed)
Patient called stating that she had COVID-19 and was hospitalized 4/30 21. She is still having symptoms of SOB and a cough that produces phlegm  She states it is clea and has some brown in it.  She states that she had stopped using her inhaler.  She was seen in ER 5/28 for her symptoms. Per notes she was negative for PE  She was discharged. Today she calls stating that she is using the inhaler and is better. She is concerned because she is coughing up cold. Patient was reassured. She will complete her COVID-19 test she has scheduled today.  She is concerned that she can still pass the germ. Patient was advised to follow the directions she received when leaving the hospital. Drink plenty of fluids and rest. Seek emergency care for increasing SOB chest pain.  She verbalized understanding.  Answer Assessment - Initial Assessment Questions 1. RESPIRATORY STATUS: "Describe your breathing?" (e.g., wheezing, shortness of breath, unable to speak, severe coughing)     SOB 2. ONSET: "When did this breathing problem begin?"     After Hospitalization COVID-19 3. PATTERN "Does the difficult breathing come and go, or has it been constant since it started?"      Comes and goes 4. SEVERITY: "How bad is your breathing?" (e.g., mild, moderate, severe)    - MILD: No SOB at rest, mild SOB with walking, speaks normally in sentences, can lay down, no retractions, pulse < 100.    - MODERATE: SOB at rest, SOB with minimal exertion and prefers to sit, cannot lie down flat, speaks in phrases, mild retractions, audible wheezing, pulse 100-120.    - SEVERE: Very SOB at rest, speaks in single words, struggling to breathe, sitting hunched forward, retractions, pulse > 120      Cough up cold clear with  brown 5. RECURRENT SYMPTOM: "Have you had difficulty breathing before?" If so, ask: "When was the last time?" and "What happened that time?"      COVID-19 6. CARDIAC HISTORY: "Do you have any history of heart disease?" (e.g., heart  attack, angina, bypass surgery, angioplasty)      no 7. LUNG HISTORY: "Do you have any history of lung disease?"  (e.g., pulmonary embolus, asthma, emphysema)     no 8. CAUSE: "What do you think is causing the breathing problem?"      COVID-19 9. OTHER SYMPTOMS: "Do you have any other symptoms? (e.g., dizziness, runny nose, cough, chest pain, fever)   Cough 10. PREGNANCY: "Is there any chance you are pregnant?" "When was your last menstrual period?"      No mestal period now 48. TRAVEL: "Have you traveled out of the country in the last month?" (e.g., travel history, exposures)       N/A  Protocols used: BREATHING DIFFICULTY-A-AH

## 2019-11-11 LAB — SARS-COV-2, NAA 2 DAY TAT

## 2019-11-11 LAB — NOVEL CORONAVIRUS, NAA: SARS-CoV-2, NAA: NOT DETECTED

## 2019-11-19 ENCOUNTER — Other Ambulatory Visit: Payer: Self-pay

## 2019-11-19 ENCOUNTER — Ambulatory Visit (INDEPENDENT_AMBULATORY_CARE_PROVIDER_SITE_OTHER): Payer: Medicaid Other | Admitting: Nurse Practitioner

## 2019-11-19 ENCOUNTER — Encounter: Payer: Self-pay | Admitting: Nurse Practitioner

## 2019-11-19 VITALS — BP 100/58 | HR 91 | Temp 97.6°F | Resp 18 | Ht 66.0 in | Wt 303.2 lb

## 2019-11-19 DIAGNOSIS — Z13220 Encounter for screening for lipoid disorders: Secondary | ICD-10-CM

## 2019-11-19 DIAGNOSIS — Z0001 Encounter for general adult medical examination with abnormal findings: Secondary | ICD-10-CM | POA: Diagnosis not present

## 2019-11-19 DIAGNOSIS — D509 Iron deficiency anemia, unspecified: Secondary | ICD-10-CM

## 2019-11-19 DIAGNOSIS — Z13228 Encounter for screening for other metabolic disorders: Secondary | ICD-10-CM | POA: Diagnosis not present

## 2019-11-19 DIAGNOSIS — Z1159 Encounter for screening for other viral diseases: Secondary | ICD-10-CM | POA: Diagnosis not present

## 2019-11-19 DIAGNOSIS — Z0000 Encounter for general adult medical examination without abnormal findings: Secondary | ICD-10-CM

## 2019-11-19 DIAGNOSIS — Z7689 Persons encountering health services in other specified circumstances: Secondary | ICD-10-CM

## 2019-11-19 NOTE — Progress Notes (Signed)
New Patient Office Visit  Subjective:  Patient ID: Kristen Kline, female    DOB: 1985-10-20  Age: 34 y.o. MRN: [id number]  CC:  Chief Complaint  Patient presents with  . Establish Care    HPI Kristen Kline is a 34 year old female presenting to establish care in general wellness examination.  The patient approximately 1 month ago.  She continues to use her inhalers for occasional shortness of breath on exertion.  She has not had her Covid vaccine and insulin 2030 months post Covid she also does not know when her last Tdap was she again will wait 3 months post Covid at least.  The patient will schedule her Pap smear as it is due with her GYN her last menstrual period was 11/04/2019.  Discussed blood pressure goal of 140/90 or less and taking our call attention if out of his range.  The patient is overweight and we discussed weight loss and generally feeling good.  Patient had gallbladder removed in 2007 discussed dumping syndrome patient did have knowledge of etiology of diet related to dumping syndrome episodes.    No chest pain, chest tightness, GU, GI symptoms, pain, shortness of breath, edema, palpitations, or falls.    Past Medical History:  Diagnosis Date  . Anxiety   . Hx of cholecystectomy   . Morbid obesity ([place])     Past Surgical History:  Procedure Laterality Date  . CESAREAN SECTION    . CESAREAN SECTION N/A    [employer]rees[employer] 11/17/2019  . CHOLECYSTECTOMY    . WISDOM TOOTH EXTRACTION      Family History  Problem Relation Age of Onset  . Heart failure Mother   . Diabetes Mother   . Hypertension Father   . Stroke Paternal Grandfather   . Stroke Paternal Grandmother     Social History   Socioeconomic History  . Marital status: Single    Spouse name: Not on file  . Number of children: Not on file  . Years of education: Not on file  . Highest education level: Not on file  Occupational History  . Not on file  Tobacco Use  . Smoking status: Former  Research scientist (life sciences)  . Smokeless tobacco: Never Used  Vaping Use  . Vaping Use: Never used  Substance and Sexual Activity  . Alcohol use: No  . Drug use: No  . Sexual activity: Yes    Birth control/protection: None  Other Topics Concern  . Not on file  Social History Narrative  . Not on file   Social Determinants of Health   Financial Resource Strain:   . Difficulty of Paying Living Expenses:   Food Insecurity:   . Worried About Charity fundraiser in the Last Year:   . Arboriculturist in the Last Year:   Transportation Needs:   . Film/video editor (Medical):   Marland Kitchen Lack of Transportation (Non-Medical):   Physical Activity:   . Days of Exercise per Week:   . Minutes of Exercise per Session:   Stress:   . Feeling of Stress :   Social Connections:   . Frequency of Communication with Friends and Family:   . Frequency of Social Gatherings with Friends and Family:   . Attends Religious Services:   . Active Member of Clubs or Organizations:   . Attends Archivist Meetings:   Marland Kitchen Marital Status:   Intimate Partner Violence:   . Fear of Current or Ex-Partner:   . Emotionally Abused:   .  Physically Abused:   . Sexually Abused:     ROS Review of Systems  All other systems reviewed and are negative.   Objective:   Today's Vitals: BP (!) 100/58 (BP Location: Left Arm, Patient Position: Sitting, Cuff Size: Large)   Pulse 91   Temp 97.6 F (36.4 C) (Temporal)   Resp 18   Ht 5\' 6"  (1.676 m)   Wt (!) 303 lb 3.2 oz (137.5 kg)   SpO2 100%   BMI 48.94 kg/m   Physical Exam Vitals and nursing note reviewed.  Constitutional:      Appearance: Normal appearance. She is well-developed and well-groomed. She is not ill-appearing or toxic-appearing.  HENT:     Head: Normocephalic.     Right Ear: Hearing and external ear normal.     Left Ear: Hearing and external ear normal.     Nose: Nose normal.     Mouth/Throat:     Lips: Pink.     Mouth: Mucous membranes are moist.      Dentition: Normal dentition.     Tongue: No lesions. Tongue does not deviate from midline.     Pharynx: Oropharynx is clear.  Eyes:     General: Lids are normal. Lids are everted, no foreign bodies appreciated.     Extraocular Movements: Extraocular movements intact.     Conjunctiva/sclera: Conjunctivae normal.     Pupils: Pupils are equal, round, and reactive to light.  Neck:     Thyroid: No thyromegaly.     Vascular: No carotid bruit or JVD.  Cardiovascular:     Rate and Rhythm: Normal rate and regular rhythm.     Pulses: Normal pulses.     Heart sounds: Normal heart sounds, S1 normal and S2 normal.  Pulmonary:     Effort: Pulmonary effort is normal.     Breath sounds: Normal breath sounds.  Chest:     Chest wall: No deformity.  Abdominal:     General: Abdomen is flat. Bowel sounds are normal. There is no abdominal bruit.     Palpations: There is no hepatomegaly or splenomegaly.     Tenderness: There is no abdominal tenderness. There is no guarding.  Musculoskeletal:        General: Normal range of motion.     Cervical back: Full passive range of motion without pain, normal range of motion and neck supple.     Right lower leg: No edema.     Left lower leg: No edema.  Lymphadenopathy:     Head:     Right side of head: No submental, submandibular, tonsillar or occipital adenopathy.     Left side of head: No submental, submandibular, tonsillar or occipital adenopathy.     Cervical: No cervical adenopathy.  Skin:    General: Skin is warm.     Capillary Refill: Capillary refill takes less than 2 seconds.     Coloration: Skin is not jaundiced.     Findings: No bruising.  Neurological:     General: No focal deficit present.     Mental Status: She is alert and oriented to person, place, and time.     Cranial Nerves: Cranial nerves are intact.     Sensory: Sensation is intact.     Motor: Motor function is intact.  Psychiatric:        Attention and Perception: Attention and  perception normal.        Mood and Affect: Mood and affect normal.  Speech: Speech normal.        Behavior: Behavior normal. Behavior is cooperative.        Thought Content: Thought content normal.        Cognition and Memory: Cognition and memory normal.        Judgment: Judgment normal.     Assessment & Plan:  Drink plenty of water Follow a low-fat, low carbohydrate, low-cholesterol, low sugar diet Get 20 minutes of exercise at least 4 times per week Pressure goals 140/90 or less seek medical attention for out of this range Completed today, clinic will call you with abnormal results and directions At least annually for wellness examination with labs 1 week prior to your visit  Problem List Items Addressed This Visit      Other   Morbid obesity (HCC)   Relevant Orders   T4, free   Hemoglobin A1c   VITAMIN D 25 Hydroxy (Vit-D Deficiency, Fractures)   TSH   Microcytic anemia   Relevant Orders   CBC with Differential/Platelet    Other Visit Diagnoses    Adult general medical exam    -  Primary   Screening, lipid       Relevant Orders   Lipid panel   Screening for metabolic disorder       Relevant Orders   CBC with Differential/Platelet   COMPLETE METABOLIC PANEL WITH GFR   VITAMIN D 25 Hydroxy (Vit-D Deficiency, Fractures)   Need for hepatitis C screening test       Relevant Orders   Hepatitis panel, acute   Encounter to establish care        Drink plenty of water Get 20 minutes of exercise at least 4 times per week A low-carb, low-fat, low-cholesterol, low sugar diet Lab results will be called to you with abnormals and instructions Blood pressure goal 140/90 or less seek medical attention for out of this range Follow-up plan for 1 year with labs 1 week prior to your appointment  Outpatient Encounter Medications as of 11/19/2019  Medication Sig  . albuterol (VENTOLIN HFA) 108 (90 Base) MCG/ACT inhaler Inhale 2 puffs into the lungs every 4 (four) hours as  needed for wheezing or shortness of breath.  Marland Kitchen ascorbic acid (VITAMIN C) 500 MG tablet Take 1 tablet (500 mg total) by mouth daily.  . ferrous sulfate 325 (65 FE) MG tablet Take 1 tablet (325 mg total) by mouth daily with breakfast.  . Multiple Vitamin (MULTIVITAMIN) tablet Take 1 tablet by mouth daily.  Marland Kitchen zinc sulfate 220 (50 Zn) MG capsule Take 1 capsule (220 mg total) by mouth daily.  . [DISCONTINUED] guaiFENesin-dextromethorphan (ROBITUSSIN DM) 100-10 MG/5ML syrup Take 10 mLs by mouth every 4 (four) hours as needed for cough.   No facility-administered encounter medications on file as of 11/19/2019.    Follow-up: Return in about 1 year (around 11/18/2020) for ape with labs one wek prior .   Elmore Guise, FNP

## 2019-11-19 NOTE — Patient Instructions (Signed)
Drink plenty of water Get 20 minutes of exercise at least 4 times per week A low-carb, low-fat, low-cholesterol, low sugar diet Lab results will be called to you with abnormals and instructions Blood pressure goal 140/90 or less seek medical attention for out of this range Follow-up plan for 1 year with labs 1 week prior to your appointment

## 2019-11-20 LAB — COMPLETE METABOLIC PANEL WITH GFR
AG Ratio: 1.4 (calc) (ref 1.0–2.5)
ALT: 15 U/L (ref 6–29)
AST: 15 U/L (ref 10–30)
Albumin: 4.1 g/dL (ref 3.6–5.1)
Alkaline phosphatase (APISO): 65 U/L (ref 31–125)
BUN: 7 mg/dL (ref 7–25)
CO2: 21 mmol/L (ref 20–32)
Calcium: 9.6 mg/dL (ref 8.6–10.2)
Chloride: 104 mmol/L (ref 98–110)
Creat: 0.75 mg/dL (ref 0.50–1.10)
GFR, Est African American: 123 mL/min/{1.73_m2} (ref >=60)
GFR, Est Non African American: 106 mL/min/{1.73_m2} (ref >=60)
Globulin: 2.9 g/dL (calc) (ref 1.9–3.7)
Glucose, Bld: 99 mg/dL (ref 65–99)
Potassium: 3.9 mmol/L (ref 3.5–5.3)
Sodium: 140 mmol/L (ref 135–146)
Total Bilirubin: 0.3 mg/dL (ref 0.2–1.2)
Total Protein: 7 g/dL (ref 6.1–8.1)

## 2019-11-20 LAB — CBC WITH DIFFERENTIAL/PLATELET
Absolute Monocytes: 469 cells/uL (ref 200–950)
Basophils Absolute: 57 cells/uL (ref 0–200)
Basophils Relative: 0.8 %
Eosinophils Absolute: 156 cells/uL (ref 15–500)
Eosinophils Relative: 2.2 %
HCT: 35.3 % (ref 35.0–45.0)
Hemoglobin: 11.5 g/dL — ABNORMAL LOW (ref 11.7–15.5)
Lymphs Abs: 2080 cells/uL (ref 850–3900)
MCH: 25.9 pg — ABNORMAL LOW (ref 27.0–33.0)
MCHC: 32.6 g/dL (ref 32.0–36.0)
MCV: 79.5 fL — ABNORMAL LOW (ref 80.0–100.0)
MPV: 8.8 fL (ref 7.5–12.5)
Monocytes Relative: 6.6 %
Neutro Abs: 4338 cells/uL (ref 1500–7800)
Neutrophils Relative %: 61.1 %
Platelets: 532 10*3/uL — ABNORMAL HIGH (ref 140–400)
RBC: 4.44 10*6/uL (ref 3.80–5.10)
RDW: 21.9 % — ABNORMAL HIGH (ref 11.0–15.0)
Total Lymphocyte: 29.3 %
WBC: 7.1 10*3/uL (ref 3.8–10.8)

## 2019-11-20 LAB — LIPID PANEL
Cholesterol: 173 mg/dL (ref <200)
HDL: 48 mg/dL — ABNORMAL LOW (ref >=50)
LDL Cholesterol (Calc): 107 mg/dL (calc) — ABNORMAL HIGH
Non-HDL Cholesterol (Calc): 125 mg/dL (calc) (ref <130)
Total CHOL/HDL Ratio: 3.6 (calc) (ref <5.0)
Triglycerides: 89 mg/dL (ref <150)

## 2019-11-20 LAB — HEPATITIS PANEL, ACUTE
Hep A IgM: NONREACTIVE
Hep B C IgM: NONREACTIVE
Hepatitis B Surface Ag: NONREACTIVE
Hepatitis C Ab: NONREACTIVE
SIGNAL TO CUT-OFF: 0.02 (ref <1.00)

## 2019-11-20 LAB — TSH: TSH: 1.02 mIU/L

## 2019-11-20 LAB — HEMOGLOBIN A1C
Hgb A1c MFr Bld: 4.8 % of total Hgb (ref <5.7)
Mean Plasma Glucose: 91 (calc)
eAG (mmol/L): 5 (calc)

## 2019-11-20 LAB — VITAMIN D 25 HYDROXY (VIT D DEFICIENCY, FRACTURES): Vit D, 25-Hydroxy: 15 ng/mL — ABNORMAL LOW (ref 30–100)

## 2019-11-20 LAB — T4, FREE: Free T4: 1.3 ng/dL (ref 0.8–1.8)

## 2019-11-23 ENCOUNTER — Other Ambulatory Visit: Payer: Self-pay | Admitting: Nurse Practitioner

## 2019-11-23 DIAGNOSIS — D509 Iron deficiency anemia, unspecified: Secondary | ICD-10-CM

## 2019-11-23 DIAGNOSIS — E559 Vitamin D deficiency, unspecified: Secondary | ICD-10-CM

## 2019-11-23 DIAGNOSIS — R7989 Other specified abnormal findings of blood chemistry: Secondary | ICD-10-CM

## 2019-11-23 MED ORDER — VITAMIN D3 1.25 MG (50000 UT) PO TABS: ORAL_TABLET | ORAL | 1 refills | Status: DC

## 2019-11-23 NOTE — Progress Notes (Signed)
1. Vitamin D is low: I want her to take RX sent over for 8 weeks. She should then start taking otc Vitamin D3 @1000IU  per day ongoing. She will repeat labs at the end of the prescription.   2. She appears to have IDA. How are her periods. When was her last period. Has she had a h/o this before. She should go ahead and take iron FE 325 mg otc twice per day with some sort of vitamin C for better absorption. If she becomes constipated take stool softner daily. Recheck labs at same time as number 1  3. Platelets elevated: this could be related to an acute infection such as UTI, any sxs? Or with her recent COVID a month ago this could be a reason. If any UTI or other infectious sxs I would want testing otherwise recheck her labs at same time as #1.   4. She could lower her bad cholesterols.   5. Follow up apt with complete in 6 months.

## 2019-12-15 ENCOUNTER — Other Ambulatory Visit: Payer: Self-pay

## 2019-12-15 ENCOUNTER — Ambulatory Visit: Payer: Medicaid Other | Attending: Nurse Practitioner | Admitting: Nurse Practitioner

## 2019-12-16 ENCOUNTER — Ambulatory Visit: Payer: Medicaid Other | Admitting: Nurse Practitioner

## 2019-12-16 ENCOUNTER — Other Ambulatory Visit: Payer: Self-pay

## 2019-12-16 VITALS — BP 122/70 | HR 87 | Temp 97.6°F | Resp 18 | Wt 313.2 lb

## 2019-12-16 DIAGNOSIS — S30861A Insect bite (nonvenomous) of abdominal wall, initial encounter: Secondary | ICD-10-CM

## 2019-12-16 DIAGNOSIS — W57XXXA Bitten or stung by nonvenomous insect and other nonvenomous arthropods, initial encounter: Secondary | ICD-10-CM | POA: Diagnosis not present

## 2019-12-16 NOTE — Progress Notes (Signed)
Established Patient Office Visit  Subjective:  Patient ID: Kristen Kline, female    DOB: [birth year]  Age: 34 y.o. MRN: [id number]  CC:  Chief Complaint  Patient presents with  . Tick Removal    tick bite under stomach, was there for 4-5 days, bump, no redness    HPI Kristen Kline is a 34 year old female presenting to the clinic with reports that she pulled a tick off her her lower abdomen that was attached to her three days ago. She reports that her spouse helped pull the tick off and reported that the head could still be attached.   Past Medical History:  Diagnosis Date  . Anxiety   . Hx of cholecystectomy   . Morbid obesity (HCC)     Past Surgical History:  Procedure Laterality Date  . CESAREAN SECTION    . CESAREAN SECTION N/A    Phreesia 11/17/2019  . CHOLECYSTECTOMY    . WISDOM TOOTH EXTRACTION      Family History  Problem Relation Age of Onset  . Heart failure Mother   . Diabetes Mother   . Hypertension Father   . Stroke Paternal Grandfather   . Stroke Paternal Grandmother     Social History   Socioeconomic History  . Marital status: Single    Spouse name: Not on file  . Number of children: Not on file  . Years of education: Not on file  . Highest education level: Not on file  Occupational History  . Not on file  Tobacco Use  . Smoking status: Former Games developer  . Smokeless tobacco: Never Used  Vaping Use  . Vaping Use: Never used  Substance and Sexual Activity  . Alcohol use: No  . Drug use: No  . Sexual activity: Yes    Birth control/protection: None  Other Topics Concern  . Not on file  Social History Narrative  . Not on file   Social Determinants of Health   Financial Resource Strain:   . Difficulty of Paying Living Expenses:   Food Insecurity:   . Worried About Programme researcher, broadcasting/film/video in the Last Year:   . Barista in the Last Year:   Transportation Needs:   . Freight forwarder (Medical):   Marland Kitchen Lack of  Transportation (Non-Medical):   Physical Activity:   . Days of Exercise per Week:   . Minutes of Exercise per Session:   Stress:   . Feeling of Stress :   Social Connections:   . Frequency of Communication with Friends and Family:   . Frequency of Social Gatherings with Friends and Family:   . Attends Religious Services:   . Active Member of Clubs or Organizations:   . Attends Banker Meetings:   Marland Kitchen Marital Status:   Intimate Partner Violence:   . Fear of Current or Ex-Partner:   . Emotionally Abused:   Marland Kitchen Physically Abused:   . Sexually Abused:     Outpatient Medications Prior to Visit  Medication Sig Dispense Refill  . albuterol (VENTOLIN HFA) 108 (90 Base) MCG/ACT inhaler Inhale 2 puffs into the lungs every 4 (four) hours as needed for wheezing or shortness of breath. 8 g 0  . ascorbic acid (VITAMIN C) 500 MG tablet Take 1 tablet (500 mg total) by mouth daily. 14 tablet 0  . Cholecalciferol (VITAMIN D3) 1.25 MG (50000 UT) TABS One tablet once weekly for 8 weeks 4 tablet 1  . ferrous sulfate 325 (  65 FE) MG tablet Take 1 tablet (325 mg total) by mouth daily with breakfast. 30 tablet 3  . zinc sulfate 220 (50 Zn) MG capsule Take 1 capsule (220 mg total) by mouth daily. 14 capsule 0  . Multiple Vitamin (MULTIVITAMIN) tablet Take 1 tablet by mouth daily.     No facility-administered medications prior to visit.    No Known Allergies  ROS Review of Systems  All other systems reviewed and are negative.     Objective:    Physical Exam Vitals and nursing note reviewed.  Constitutional:      Appearance: Normal appearance.  HENT:     Head: Normocephalic.  Eyes:     Extraocular Movements: Extraocular movements intact.     Conjunctiva/sclera: Conjunctivae normal.  Cardiovascular:     Rate and Rhythm: Normal rate.  Pulmonary:     Effort: Pulmonary effort is normal.  Abdominal:     General: There is no distension.  Musculoskeletal:        General: Normal range  of motion.     Cervical back: Normal range of motion and neck supple.     Right lower leg: No edema.     Left lower leg: No edema.  Skin:    General: Skin is warm and dry.     Coloration: Skin is not jaundiced.     Findings: No bruising or erythema.          Comments: Tick bite site healing not erythremic or edematous. No drainage or foul odor  Neurological:     General: No focal deficit present.     Mental Status: She is alert and oriented to person, place, and time.  Psychiatric:        Mood and Affect: Mood normal.        Behavior: Behavior normal.        Thought Content: Thought content normal.        Judgment: Judgment normal.     BP 122/70 (BP Location: Left Arm, Patient Position: Sitting, Cuff Size: Large)   Pulse 87   Temp 97.6 F (36.4 C) (Temporal)   Resp 18   Wt (!) 313 lb 3.2 oz (142.1 kg)   SpO2 98%   BMI 50.55 kg/m  Wt Readings from Last 3 Encounters:  12/16/19 (!) 313 lb 3.2 oz (142.1 kg)  11/19/19 (!) 303 lb 3.2 oz (137.5 kg)  11/06/19 (!) 360 lb 0.2 oz (163.3 kg)     Health Maintenance Due  Topic Date Due  . COVID-19 Vaccine (1) Never done  . PAP SMEAR-Modifier  Never done    There are no preventive care reminders to display for this patient.  Lab Results  Component Value Date   TSH 1.02 11/19/2019   Lab Results  Component Value Date   WBC 7.1 11/19/2019   HGB 11.5 (L) 11/19/2019   HCT 35.3 11/19/2019   MCV 79.5 (L) 11/19/2019   PLT 532 (H) 11/19/2019   Lab Results  Component Value Date   NA 140 11/19/2019   K 3.9 11/19/2019   CO2 21 11/19/2019   GLUCOSE 99 11/19/2019   BUN 7 11/19/2019   CREATININE 0.75 11/19/2019   BILITOT 0.3 11/19/2019   ALKPHOS 64 10/13/2019   AST 15 11/19/2019   ALT 15 11/19/2019   PROT 7.0 11/19/2019   ALBUMIN 3.3 (L) 10/13/2019   CALCIUM 9.6 11/19/2019   ANIONGAP 11 11/06/2019   Lab Results  Component Value Date   CHOL 173 11/19/2019  Lab Results  Component Value Date   HDL 48 (L) 11/19/2019    Lab Results  Component Value Date   LDLCALC 107 (H) 11/19/2019   Lab Results  Component Value Date   TRIG 89 11/19/2019   Lab Results  Component Value Date   CHOLHDL 3.6 11/19/2019   Lab Results  Component Value Date   HGBA1C 4.8 11/19/2019      Assessment & Plan:   Problem List Items Addressed This Visit    None    Visit Diagnoses    Tick bite, initial encounter    -  Primary     The tick bite appears to be healing. No infection or bulls eye rash-Lyme's diease appearance. Continue to monitor your symptoms and seek medical attention as discussed. Educational print out and uploaded into [place] for your review.   Follow-up: Return if symptoms worsen or fail to improve.    Elmore Guise, FNP

## 2020-01-11 ENCOUNTER — Other Ambulatory Visit: Payer: Self-pay | Admitting: Nurse Practitioner

## 2020-01-11 DIAGNOSIS — E559 Vitamin D deficiency, unspecified: Secondary | ICD-10-CM

## 2020-01-25 ENCOUNTER — Other Ambulatory Visit: Payer: Self-pay

## 2020-01-25 MED ORDER — ALBUTEROL SULFATE HFA 108 (90 BASE) MCG/ACT IN AERS: 2.0000 | INHALATION_SPRAY | RESPIRATORY_TRACT | 3 refills | Status: DC | PRN

## 2020-04-07 ENCOUNTER — Telehealth: Payer: Self-pay

## 2020-04-07 NOTE — Telephone Encounter (Signed)
Pharmacy wanting refill on Vitamin D 50,000 units, attempted to contact patient but number was disconnected. Seen patient was last seen on 12/16/2019 and labs were done on 6/10/201. Was unsure if patient received medications the last time and has pending labs for her Vitamin D recheck. Please advise on further options to this. Mychart message sent to patient.

## 2020-04-08 NOTE — Telephone Encounter (Signed)
Agree pt needs appt

## 2020-07-14 IMAGING — CT CT ANGIO CHEST
2 of 9 series · 18 of 36 positions shown · IV contrast (Omnipaque)
Comparison: None.

CLINICAL DATA: PE suspected. Positive D-dimer.

EXAM:
CT ANGIOGRAPHY CHEST WITH CONTRAST
TECHNIQUE: Multidetector CT imaging of the chest was performed using the
standard protocol during bolus administration of intravenous
contrast. Multiplanar CT image reconstructions and MIPs were
obtained to evaluate the vascular anatomy.
CONTRAST:  77mL OMNIPAQUE IOHEXOL 350 MG/ML SOLN

[Series 7: pe thins · axial · 0.81mm/px · z∈[-303,-58]mm · 17 of 275 slices shown]
[im 15/275  lung]
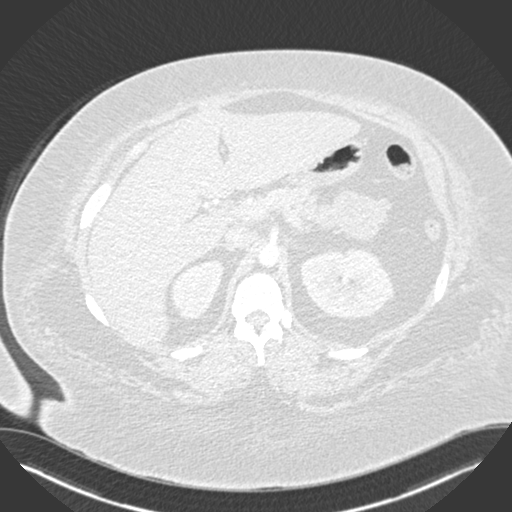
[im 29/275  mediastinal]
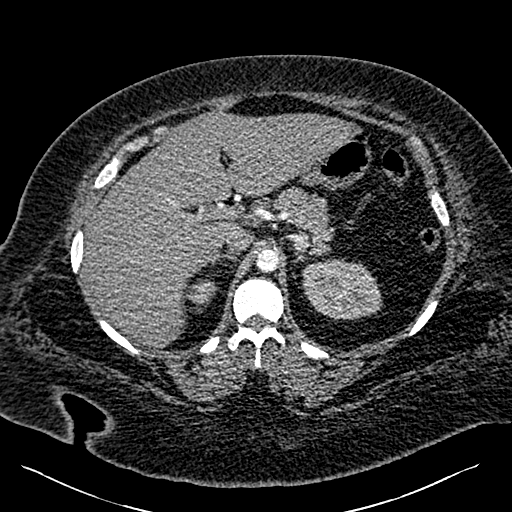
[im 44/275  lung]
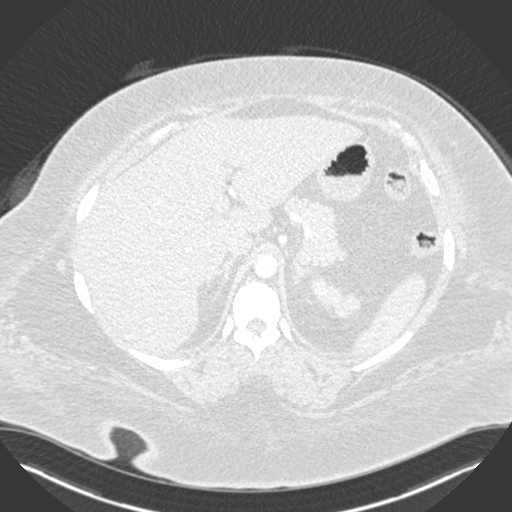
[im 58/275  mediastinal]
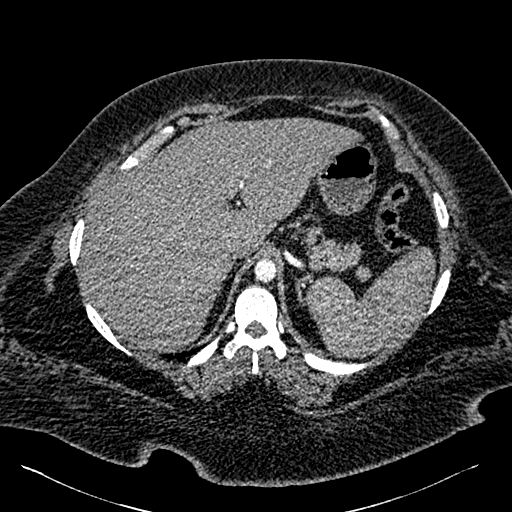
[im 73/275  lung]
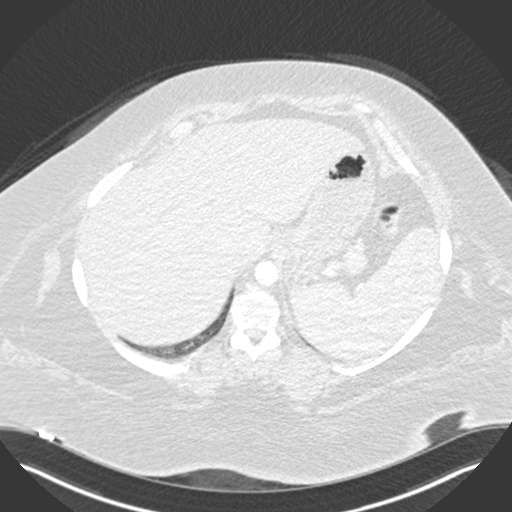
[im 87/275  mediastinal]
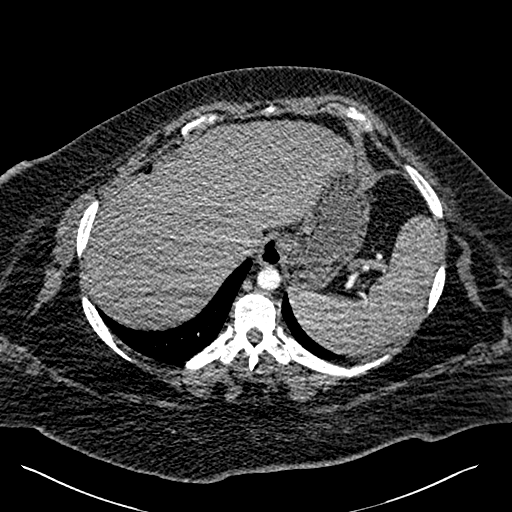
[im 101/275  lung]
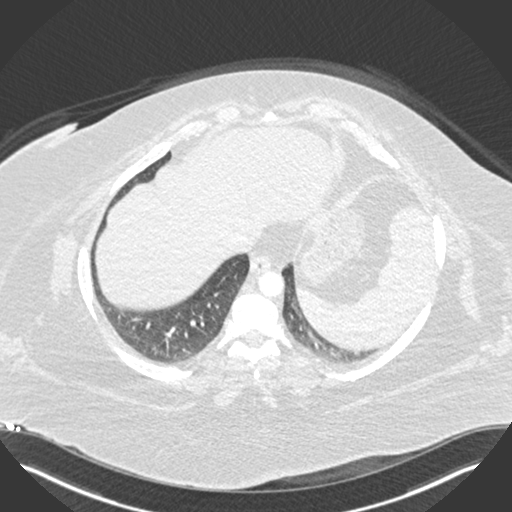
[im 116/275  mediastinal]
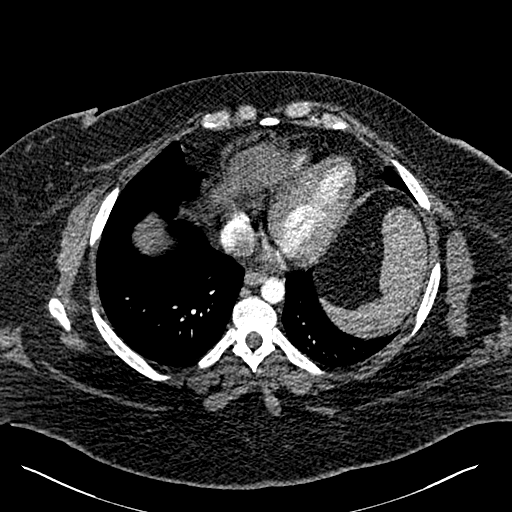
[im 145/275  lung]
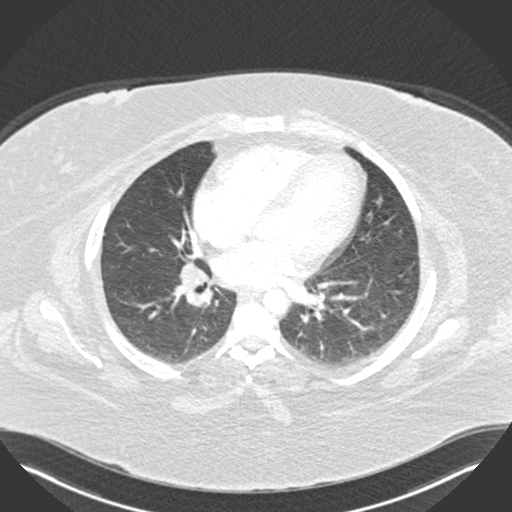
[im 159/275  mediastinal]
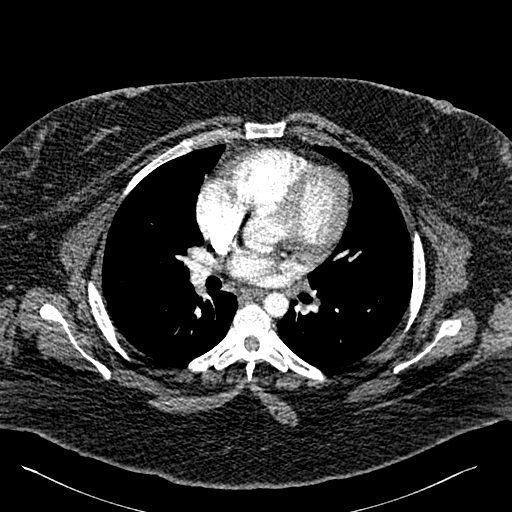
[im 174/275  lung]
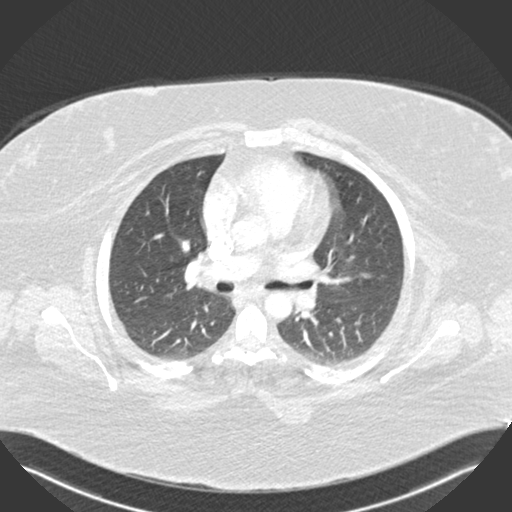
[im 188/275  mediastinal]
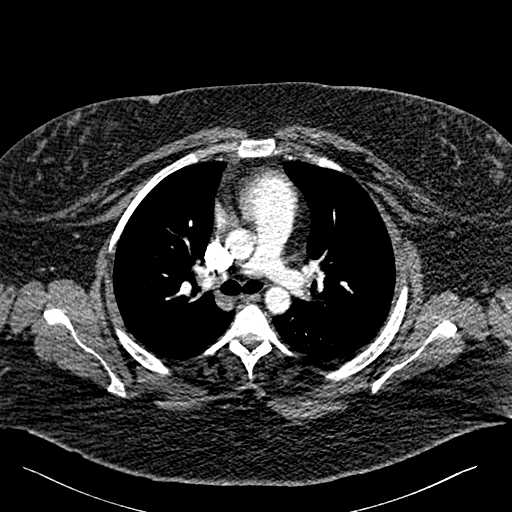
[im 202/275  lung]
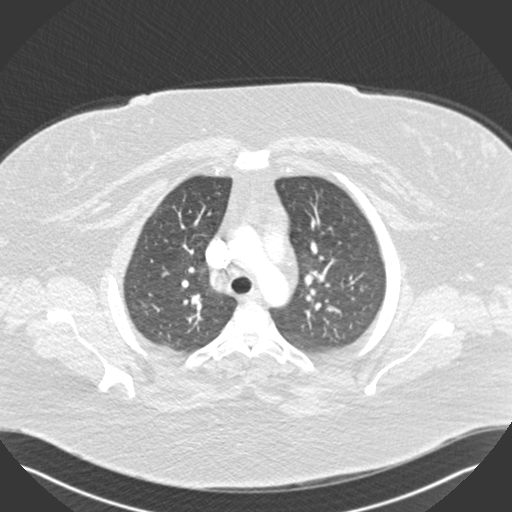
[im 217/275  mediastinal]
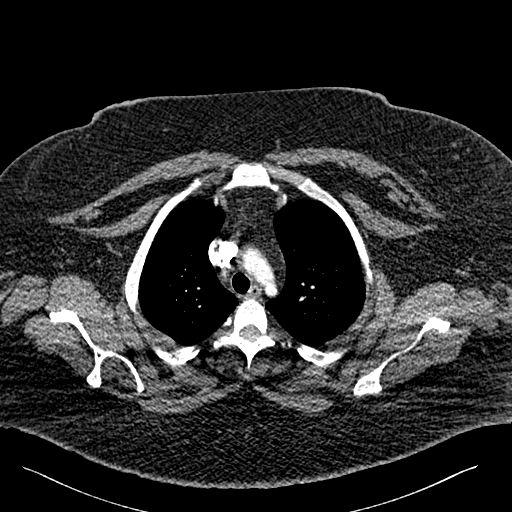
[im 231/275  lung]
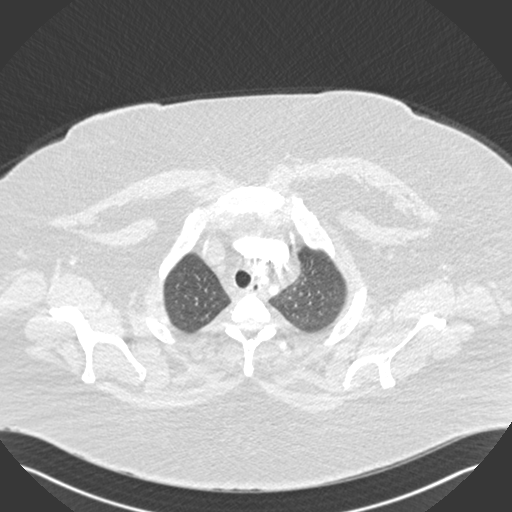
[im 246/275  mediastinal]
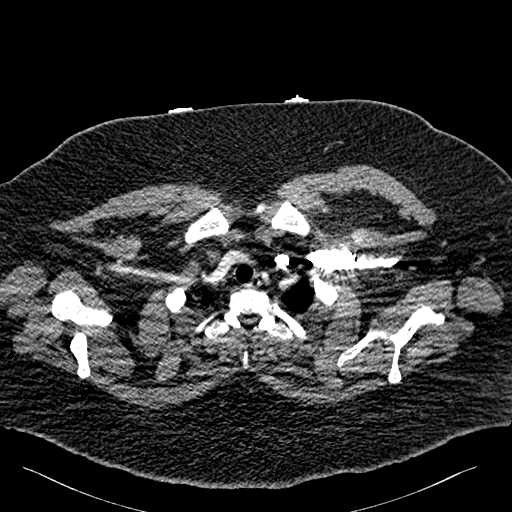
[im 260/275  lung]
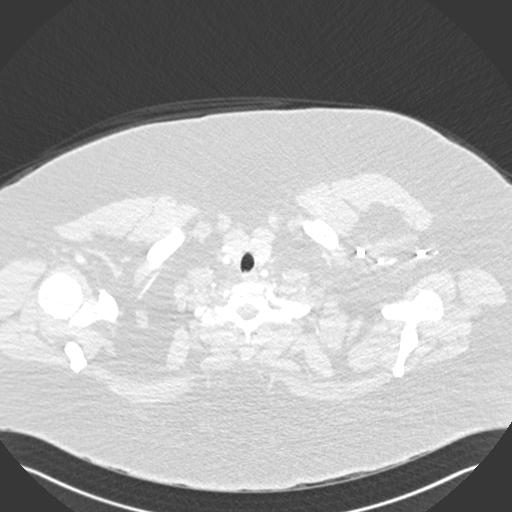

[Series 8: pe coronal mpr · coronal · 0.55mm/px · 1 of 138 slices shown]
[im 69/138  mediastinal]
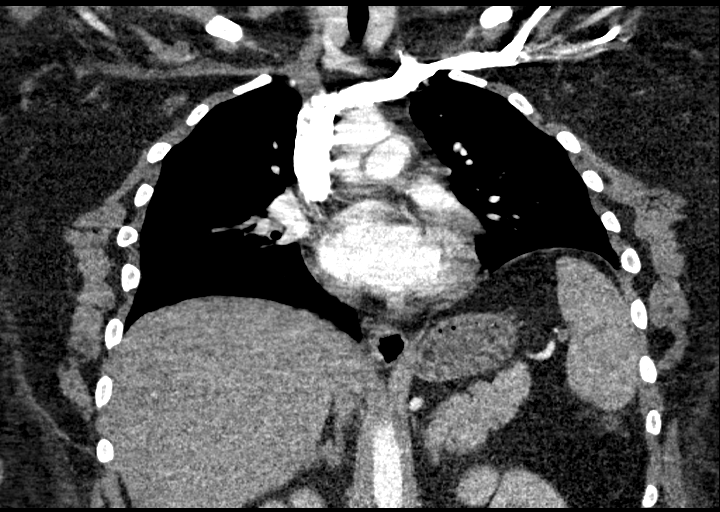

[18 of 36 positions shown; findings below may reference images not displayed]

FINDINGS: Cardiovascular: Evaluation for acute pulmonary emboli is
significantly limited by respiratory motion artifact, contrast bolus
timing, and the patient's body habitus.Given the significant
limitations, no large centrally located pulmonary embolism was
detected. The main pulmonary artery is within normal limits for
size. There is no CT evidence of acute right heart strain. The
visualized aorta is normal. Heart size is normal, without
pericardial effusion.

Mediastinum/Nodes:

--No mediastinal or hilar lymphadenopathy.

--No axillary lymphadenopathy.

--No supraclavicular lymphadenopathy.

--Normal thyroid gland.

--The esophagus is unremarkable

Lungs/Pleura: There is a 7 mm nodular like density in the anterior
right upper lobe (axial series 6, image 47). There is a 4 mm
perifissural nodule involving the left lower lobe (axial series 6,
image 39). Otherwise, the lungs are clear without evidence for
pneumothorax. There is no significant pleural effusion.

Upper Abdomen: No acute abnormality.

Musculoskeletal: No chest wall abnormality. No acute or significant
osseous findings.

Review of the MIP images confirms the above findings.
IMPRESSION: 1. Evaluation for acute pulmonary emboli is significantly limited by
respiratory motion artifact, contrast bolus timing, and the
patient's body habitus. Given the significant limitations, no large
centrally located pulmonary embolism was detected.
2. There are 2 pulmonary nodules measuring up to 7 mm. Non-contrast
chest CT at 3-6 months is recommended. If the nodules are stable at
time of repeat CT, then future CT at 18-24 months (from today's
scan) is considered optional for low-risk patients, but is
recommended for high-risk patients. This recommendation follows the
consensus statement: Guidelines for Management of Incidental
Pulmonary Nodules Detected on CT Images: From the [HOSPITAL]

## 2020-07-18 ENCOUNTER — Ambulatory Visit: Payer: Medicaid Other | Admitting: Family Medicine

## 2020-10-21 ENCOUNTER — Telehealth: Payer: Medicaid Other | Admitting: Emergency Medicine

## 2020-10-21 DIAGNOSIS — R300 Dysuria: Secondary | ICD-10-CM

## 2020-10-21 NOTE — Progress Notes (Signed)
Hi Kristen Kline,  Based on what you shared with me, having vaginal pain with abdominal pain, nausea and weakness, I feel your condition warrants further evaluation and I recommend that you be seen in a face to face office visit.  These symptoms could be due to a kidney infection, vaginal infection treated different than urinary infections or something else.  An in-person visit will allow you to get the most accurate diagnosis and treatment.    NOTE: If you entered your credit card information for this eVisit, you will not be charged. You may see a "hold" on your card for the $35 but that hold will drop off and you will not have a charge processed.   If you are having a true medical emergency please call 911.      For an urgent face to face visit, [place] has six urgent care centers for your convenience:     [facility] at [facility] Directions [phone] [address] [place], [place] [address] . 8 am - 4 pm Monday - Friday    [facility]) Get Driving Directions [phone] [address] [place], [place] [address] . 8 am to 8 pm Monday-Friday . 10 am to 6 pm [facility] Urgent [facility]) Get Driving Directions [phone]  [address] [place],  [place]  [address] . 8 am to 8 pm Monday-Friday . 8 am to 4 pm [facility] Urgent Care at [facility] Get Driving Directions [phone] [address] [place], [place] [address] . 8 am to 8 pm Monday-Friday . 8 am to 4 pm [facility] Urgent Care at [facility] Get Driving Directions  [phone] [address] [place], [place] [address] . 8 am to 8 pm Monday-Friday . 8 am to 4 pm [facility] Care at [facility] Directions [phone] [address], [address] [place], [place]  [address] . 8 am to 8 pm Monday-Friday . 8 am to 4 pm Saturday-Sunday     Your MyChart E-visit questionnaire answers were reviewed by a board certified advanced clinical practitioner to complete your personal care plan based on your specific symptoms.  Thank you for using e-Visits.    Approximately 5 minutes was spent documenting and reviewing patient's chart.

## 2020-11-02 ENCOUNTER — Telehealth: Payer: Self-pay | Admitting: *Deleted

## 2020-11-02 ENCOUNTER — Encounter: Payer: Self-pay | Admitting: Nurse Practitioner

## 2020-11-02 ENCOUNTER — Ambulatory Visit: Payer: Medicaid Other | Admitting: Nurse Practitioner

## 2020-11-02 ENCOUNTER — Other Ambulatory Visit: Payer: Self-pay

## 2020-11-02 DIAGNOSIS — K915 Postcholecystectomy syndrome: Secondary | ICD-10-CM | POA: Diagnosis not present

## 2020-11-02 DIAGNOSIS — B9689 Other specified bacterial agents as the cause of diseases classified elsewhere: Secondary | ICD-10-CM

## 2020-11-02 DIAGNOSIS — Z113 Encounter for screening for infections with a predominantly sexual mode of transmission: Secondary | ICD-10-CM | POA: Diagnosis not present

## 2020-11-02 DIAGNOSIS — N3000 Acute cystitis without hematuria: Secondary | ICD-10-CM | POA: Diagnosis not present

## 2020-11-02 DIAGNOSIS — R8290 Unspecified abnormal findings in urine: Secondary | ICD-10-CM | POA: Diagnosis not present

## 2020-11-02 DIAGNOSIS — N760 Acute vaginitis: Secondary | ICD-10-CM | POA: Diagnosis not present

## 2020-11-02 DIAGNOSIS — D509 Iron deficiency anemia, unspecified: Secondary | ICD-10-CM

## 2020-11-02 DIAGNOSIS — Z6841 Body Mass Index (BMI) 40.0 and over, adult: Secondary | ICD-10-CM | POA: Diagnosis not present

## 2020-11-02 HISTORY — DX: Postcholecystectomy syndrome: K91.5

## 2020-11-02 LAB — URINALYSIS, ROUTINE W REFLEX MICROSCOPIC
Bilirubin Urine: NEGATIVE
Glucose, UA: NEGATIVE
Hyaline Cast: NONE SEEN /LPF
Ketones, ur: NEGATIVE
Nitrite: NEGATIVE
Protein, ur: NEGATIVE
Specific Gravity, Urine: 1.025 (ref 1.001–1.035)
pH: 6 (ref 5.0–8.0)

## 2020-11-02 LAB — MICROSCOPIC MESSAGE

## 2020-11-02 LAB — PREGNANCY, URINE: Preg Test, Ur: NEGATIVE

## 2020-11-02 MED ORDER — NITROFURANTOIN MONOHYD MACRO 100 MG PO CAPS: 100.0000 mg | ORAL_CAPSULE | Freq: Two times a day (BID) | ORAL | 0 refills | Status: DC

## 2020-11-02 MED ORDER — METRONIDAZOLE 0.75 % VA GEL: 1.0000 | Freq: Every day | VAGINAL | 0 refills | Status: DC

## 2020-11-02 MED ORDER — CHOLESTYRAMINE LIGHT 4 G PO PACK: 4.0000 g | PACK | Freq: Every day | ORAL | 0 refills | Status: DC

## 2020-11-02 NOTE — Telephone Encounter (Signed)
Received request from pharmacy for PA on cholestyramine.   PA submitted.   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   PA Case [id number] approved 11/02/2020- 11/02/2021.

## 2020-11-02 NOTE — Assessment & Plan Note (Signed)
Chronic.  Discussed limiting meals with fat in them to prevent diarrhea.  Prescription given for cholestyramine daily.  We will follow-up in 1 month to see how things are going.

## 2020-11-02 NOTE — Assessment & Plan Note (Signed)
Chronic-likely due to heavy menses.  Patient reports taking iron daily.  We will recheck blood counts today.  Continue iron supplementation for now.

## 2020-11-02 NOTE — Patient Instructions (Signed)
Gallbladder Eating Plan If you have a gallbladder condition, you may have trouble digesting fats. Eating a low-fat diet can help reduce your symptoms, and may be helpful before and after having surgery to remove your gallbladder (cholecystectomy). Your health care provider may recommend that you work with a diet and nutrition specialist (dietitian) to help you reduce the amount of fat in your diet. What are tips for following this plan? General guidelines  Limit your fat intake to less than 30% of your total daily calories. If you eat around 1,800 calories each day, this is less than 60 grams (g) of fat per day.  Fat is an important part of a healthy diet. Eating a low-fat diet can make it hard to maintain a healthy body weight. Ask your dietitian how much fat, calories, and other nutrients you need each day.  Eat small, frequent meals throughout the day instead of three large meals.  Drink at least 8-10 cups of fluid a day. Drink enough fluid to keep your urine clear or pale yellow.  Limit alcohol intake to no more than 1 drink a day for nonpregnant women and 2 drinks a day for men. One drink equals 12 oz of beer, 5 oz of wine, or 1 oz of hard liquor. Reading food labels  Check Nutrition Facts on food labels for the amount of fat per serving. Choose foods with less than 3 grams of fat per serving.   Shopping  Choose nonfat and low-fat healthy foods. Look for the words "nonfat," "low fat," or "fat free."  Avoid buying processed or prepackaged foods. Cooking  Cook using low-fat methods, such as baking, broiling, grilling, or boiling.  Cook with small amounts of healthy fats, such as olive oil, grapeseed oil, canola oil, or sunflower oil. What foods are recommended?  All fresh, frozen, or canned fruits and vegetables.  Whole grains.  Low-fat or non-fat (skim) milk and yogurt.  Lean meat, skinless poultry, fish, eggs, and beans.  Low-fat protein supplement powders or  drinks.  Spices and herbs. What foods are not recommended?  High-fat foods. These include baked goods, fast food, fatty cuts of meat, ice cream, french toast, sweet rolls, pizza, cheese bread, foods covered with butter, creamy sauces, or cheese.  Fried foods. These include french fries, tempura, battered fish, breaded chicken, fried breads, and sweets.  Foods with strong odors.  Foods that cause bloating and gas. Summary  A low-fat diet can be helpful if you have a gallbladder condition, or before and after gallbladder surgery.  Limit your fat intake to less than 30% of your total daily calories. This is about 60 g of fat if you eat 1,800 calories each day.  Eat small, frequent meals throughout the day instead of three large meals. This information is not intended to replace advice given to you by your health care provider. Make sure you discuss any questions you have with your health care provider. Document Revised: 01/14/2020 Document Reviewed: 01/14/2020 Elsevier Patient Education  2021 Elsevier Inc.  

## 2020-11-02 NOTE — Progress Notes (Signed)
Subjective:    Patient ID: Kristen Kline, female    DOB: [birth year], 35 y.o.   MRN: [id number]  HPI: Kristen Kline is a 35 y.o. female presenting for  Chief Complaint  Patient presents with  . Polyuria    Having multiple trips to bathrm, little urine comes out, some stomach pain and back pain.  Does not have a gallbladder, having painful diarrhea since 2007, sx are getting worse   URINARY SYMPTOMS LMP: 10/12/2020 - heavy menses at least 5 days per month;  Duration: days - last Thursday Polyuria: yes Dysuria: burning Urinary frequency: yes Urgency: yes Small volume voids: yes Symptom severity: moderate Urinary incontinence: no Foul odor: yes Hematuria: no Abdominal pain: no Back pain: yes Suprapubic pain/pressure: yes Flank pain: no Fever: no Chills: no Hot flashes: no Nausea: no Vomiting: no Relief with cranberry juice: yes Relief with pyridium: no Status: better Previous urinary tract infection: yes Recurrent urinary tract infection: no Sexual activity: Currently sexually active with 1 female partner; no contraception History of sexually transmitted disease: yes; trich in past Vaginal discharge: no  Treatments attempted: increased water intake   Patient is requesting screening for STI today.  No areas of concern, rashes, or lesions.    History of cholesectomy in 2007; has noticed since then, after eating a meal, she has urgency with diarrhea.  When takes vitamins, it is as bad.  This happens everytime she eats or drinks anything including water.  Recently, she does report eating a lot of fried foods.  She is wondering what can be done for this.  She denies blood in her stool, nausea or vomiting, incontinence, or constipation.   No Known Allergies  Outpatient Encounter Medications as of 11/02/2020  Medication Sig  . cholestyramine light (PREVALITE) 4 g packet Take 1 packet (4 g total) by mouth daily. Take daily with a meal  . metroNIDAZOLE (METROGEL  VAGINAL) 0.75 % vaginal gel Place 1 Applicatorful vaginally at bedtime.  . nitrofurantoin, macrocrystal-monohydrate, (MACROBID) 100 MG capsule Take 1 capsule (100 mg total) by mouth 2 (two) times daily.  Marland Kitchen albuterol (VENTOLIN HFA) 108 (90 Base) MCG/ACT inhaler Inhale 2 puffs into the lungs every 4 (four) hours as needed for wheezing or shortness of breath.  Marland Kitchen ascorbic acid (VITAMIN C) 500 MG tablet Take 1 tablet (500 mg total) by mouth daily.  . Cholecalciferol (D3-50) 1.25 MG (50000 UT) capsule TAKE 1 CAPSULE BY MOUTH ONCE A WEEK X 8 WEEKS  . ferrous sulfate 325 (65 FE) MG tablet Take 1 tablet (325 mg total) by mouth daily with breakfast.  . zinc sulfate 220 (50 Zn) MG capsule Take 1 capsule (220 mg total) by mouth daily.   No facility-administered encounter medications on file as of 11/02/2020.    Patient Active Problem List   Diagnosis Date Noted  . 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 Microcytic anemia 10/10/2019  . Morbid obesity (HCC)   . UTI (lower urinary tract infection) 05/30/2015  . Morbid obesity with BMI of 50.0-59.9, adult (HCC) 05/30/2015    Past Medical History:  Diagnosis Date  . Acute hypoxemic respiratory failure due to COVID-19 (HCC) 10/09/2019  . Anxiety   . Hx of cholecystectomy   . Morbid obesity (HCC)     Relevant past medical, surgical, family and social history reviewed and updated as indicated. Interim medical history since our last visit reviewed.  Review of Systems Per HPI unless specifically indicated above     Objective:  BP 126/78   Pulse 84   Temp (!) 97 F (36.1 C)   Ht 5\' 6"  (1.676 m)   Wt (!) 341 lb 3.2 oz (154.8 kg)   LMP 10/12/2020   BMI 55.07 kg/m   Wt Readings from Last 3 Encounters:  11/02/20 (!) 341 lb 3.2 oz (154.8 kg)  12/16/19 (!) 313 lb 3.2 oz (142.1 kg)  11/19/19 (!) 303 lb 3.2 oz (137.5 kg)    Physical Exam Vitals and nursing note reviewed.  Constitutional:      General: She is not in acute distress.     Appearance: Normal appearance. She is obese. She is not toxic-appearing.  HENT:     Head: Normocephalic and atraumatic.     Right Ear: External ear normal.     Left Ear: External ear normal.  Eyes:     Extraocular Movements: Extraocular movements intact.  Cardiovascular:     Rate and Rhythm: Normal rate and regular rhythm.  Pulmonary:     Effort: Pulmonary effort is normal. No respiratory distress.     Breath sounds: Normal breath sounds. No wheezing, rhonchi or rales.  Abdominal:     General: Abdomen is flat. Bowel sounds are normal. There is no distension.     Palpations: Abdomen is soft.     Tenderness: There is no abdominal tenderness. There is no right CVA tenderness, left CVA tenderness or guarding.  Musculoskeletal:        General: Normal range of motion.     Right lower leg: No edema.     Left lower leg: No edema.  Skin:    General: Skin is warm and dry.     Coloration: Skin is not jaundiced or pale.     Findings: No erythema.  Neurological:     Mental Status: She is alert and oriented to person, place, and time.     Motor: No weakness.     Gait: Gait normal.  Psychiatric:        Mood and Affect: Mood normal.        Behavior: Behavior normal.        Thought Content: Thought content normal.        Judgment: Judgment normal.       Assessment & Plan:   Problem List Items Addressed This Visit      Digestive   Post-cholecystectomy syndrome    Chronic.  Discussed limiting meals with fat in them to prevent diarrhea.  Prescription given for cholestyramine daily.  We will follow-up in 1 month to see how things are going.      Relevant Medications   cholestyramine light (PREVALITE) 4 g packet     Other   Morbid obesity with BMI of 50.0-59.9, adult (HCC) - Primary    Encouraged significantly decreasing fried and fatty foods in diet to help with postcholecystectomy diarrhea.  Goal of physical activity 30 minutes 5 times weekly.  We will check vitamin D, lipids,  hemoglobin A1c, and metabolic panel with kidney function, liver enzymes, and electrolytes.  Patient is not fasting.  Follow-up in 1 month.      Relevant Orders   Hemoglobin A1c   CBC with Differential   Lipid Panel   COMPLETE METABOLIC PANEL WITH GFR   VITAMIN D 25 Hydroxy (Vit-D Deficiency, Fractures)   Microcytic anemia    Chronic-likely due to heavy menses.  Patient reports taking iron daily.  We will recheck blood counts today.  Continue iron supplementation for now.  Other Visit Diagnoses    Acute cystitis without hematuria       UA today showed trace leuks, 6-10 WBC, many bacteria, and clue cells.  We will treat for BV and in meantime treat cystitis with Macrobid.  Sent for culture.   Relevant Medications   nitrofurantoin, macrocrystal-monohydrate, (MACROBID) 100 MG capsule   Other Relevant Orders   Urinalysis, Routine w reflex microscopic (Completed)   C. trachomatis/N. gonorrhoeae RNA   Trichomonas vaginalis, RNA   Urine Culture   Screening examination for STD (sexually transmitted disease)       Relevant Orders   HIV Antibody (routine testing w rflx)   RPR   Pregnancy, urine (Completed)   Bacterial vaginosis       Relevant Medications   metroNIDAZOLE (METROGEL VAGINAL) 0.75 % vaginal gel   nitrofurantoin, macrocrystal-monohydrate, (MACROBID) 100 MG capsule       Follow up plan: Return in about 4 weeks (around 11/30/2020) for diarrhea follow up.

## 2020-11-02 NOTE — Assessment & Plan Note (Addendum)
Encouraged significantly decreasing fried and fatty foods in diet to help with postcholecystectomy diarrhea.  Goal of physical activity 30 minutes 5 times weekly.  We will check vitamin D, lipids, hemoglobin A1c, and metabolic panel with kidney function, liver enzymes, and electrolytes.  Patient is not fasting.  Follow-up in 1 month.

## 2020-11-03 LAB — COMPLETE METABOLIC PANEL WITH GFR
AG Ratio: 1.3 (calc) (ref 1.0–2.5)
ALT: 10 U/L (ref 6–29)
AST: 12 U/L (ref 10–30)
Albumin: 3.9 g/dL (ref 3.6–5.1)
Alkaline phosphatase (APISO): 68 U/L (ref 31–125)
BUN: 10 mg/dL (ref 7–25)
CO2: 26 mmol/L (ref 20–32)
Calcium: 8.8 mg/dL (ref 8.6–10.2)
Chloride: 105 mmol/L (ref 98–110)
Creat: 0.67 mg/dL (ref 0.50–1.10)
GFR, Est African American: 135 mL/min/{1.73_m2} (ref >=60)
GFR, Est Non African American: 116 mL/min/{1.73_m2} (ref >=60)
Globulin: 3 g/dL (calc) (ref 1.9–3.7)
Glucose, Bld: 105 mg/dL — ABNORMAL HIGH (ref 65–99)
Potassium: 4.4 mmol/L (ref 3.5–5.3)
Sodium: 138 mmol/L (ref 135–146)
Total Bilirubin: 0.3 mg/dL (ref 0.2–1.2)
Total Protein: 6.9 g/dL (ref 6.1–8.1)

## 2020-11-03 LAB — HIV ANTIBODY (ROUTINE TESTING W REFLEX): HIV 1&2 Ab, 4th Generation: NONREACTIVE

## 2020-11-03 LAB — CBC WITH DIFFERENTIAL/PLATELET
Absolute Monocytes: 400 cells/uL (ref 200–950)
Basophils Absolute: 44 cells/uL (ref 0–200)
Basophils Relative: 0.4 %
Eosinophils Absolute: 244 cells/uL (ref 15–500)
Eosinophils Relative: 2.2 %
HCT: 35.1 % (ref 35.0–45.0)
Hemoglobin: 11.8 g/dL (ref 11.7–15.5)
Lymphs Abs: 2997 cells/uL (ref 850–3900)
MCH: 27.3 pg (ref 27.0–33.0)
MCHC: 33.6 g/dL (ref 32.0–36.0)
MCV: 81.3 fL (ref 80.0–100.0)
MPV: 8.8 fL (ref 7.5–12.5)
Monocytes Relative: 3.6 %
Neutro Abs: 7415 cells/uL (ref 1500–7800)
Neutrophils Relative %: 66.8 %
Platelets: 419 10*3/uL — ABNORMAL HIGH (ref 140–400)
RBC: 4.32 10*6/uL (ref 3.80–5.10)
RDW: 14 % (ref 11.0–15.0)
Total Lymphocyte: 27 %
WBC: 11.1 10*3/uL — ABNORMAL HIGH (ref 3.8–10.8)

## 2020-11-03 LAB — LIPID PANEL
Cholesterol: 201 mg/dL — ABNORMAL HIGH (ref <200)
HDL: 55 mg/dL (ref >=50)
LDL Cholesterol (Calc): 128 mg/dL (calc) — ABNORMAL HIGH
Non-HDL Cholesterol (Calc): 146 mg/dL (calc) — ABNORMAL HIGH (ref <130)
Total CHOL/HDL Ratio: 3.7 (calc) (ref <5.0)
Triglycerides: 81 mg/dL (ref <150)

## 2020-11-03 LAB — RPR: RPR Ser Ql: NONREACTIVE

## 2020-11-03 LAB — HEMOGLOBIN A1C
Hgb A1c MFr Bld: 5.9 % of total Hgb — ABNORMAL HIGH (ref <5.7)
Mean Plasma Glucose: 123 mg/dL
eAG (mmol/L): 6.8 mmol/L

## 2020-11-03 LAB — VITAMIN D 25 HYDROXY (VIT D DEFICIENCY, FRACTURES): Vit D, 25-Hydroxy: 26 ng/mL — ABNORMAL LOW (ref 30–100)

## 2020-11-03 LAB — C. TRACHOMATIS/N. GONORRHOEAE RNA
C. trachomatis RNA, TMA: NOT DETECTED
N. gonorrhoeae RNA, TMA: NOT DETECTED

## 2020-11-03 LAB — TRICHOMONAS VAGINALIS, PROBE AMP: Trichomonas vaginalis RNA: NOT DETECTED

## 2020-11-04 LAB — URINE CULTURE
MICRO NUMBER:: 11933594
SPECIMEN QUALITY:: ADEQUATE

## 2020-12-01 ENCOUNTER — Other Ambulatory Visit: Payer: Self-pay

## 2020-12-01 DIAGNOSIS — K915 Postcholecystectomy syndrome: Secondary | ICD-10-CM

## 2020-12-01 MED ORDER — CHOLESTYRAMINE LIGHT 4 G PO PACK: 4.0000 g | PACK | Freq: Every day | ORAL | 0 refills | Status: DC

## 2020-12-02 ENCOUNTER — Encounter: Payer: Self-pay | Admitting: Nurse Practitioner

## 2020-12-02 ENCOUNTER — Other Ambulatory Visit: Payer: Self-pay

## 2020-12-02 ENCOUNTER — Ambulatory Visit (INDEPENDENT_AMBULATORY_CARE_PROVIDER_SITE_OTHER): Payer: Medicaid Other | Admitting: Nurse Practitioner

## 2020-12-02 VITALS — BP 124/78 | HR 89 | Temp 98.5°F | Ht 66.0 in | Wt 340.2 lb

## 2020-12-02 DIAGNOSIS — Z6841 Body Mass Index (BMI) 40.0 and over, adult: Secondary | ICD-10-CM

## 2020-12-02 DIAGNOSIS — N920 Excessive and frequent menstruation with regular cycle: Secondary | ICD-10-CM | POA: Diagnosis not present

## 2020-12-02 DIAGNOSIS — R7303 Prediabetes: Secondary | ICD-10-CM | POA: Diagnosis not present

## 2020-12-02 DIAGNOSIS — Z23 Encounter for immunization: Secondary | ICD-10-CM

## 2020-12-02 DIAGNOSIS — E7841 Elevated Lipoprotein(a): Secondary | ICD-10-CM

## 2020-12-02 DIAGNOSIS — Z0001 Encounter for general adult medical examination with abnormal findings: Secondary | ICD-10-CM

## 2020-12-02 DIAGNOSIS — R8290 Unspecified abnormal findings in urine: Secondary | ICD-10-CM | POA: Diagnosis not present

## 2020-12-02 DIAGNOSIS — E785 Hyperlipidemia, unspecified: Secondary | ICD-10-CM | POA: Insufficient documentation

## 2020-12-02 DIAGNOSIS — Z0000 Encounter for general adult medical examination without abnormal findings: Secondary | ICD-10-CM

## 2020-12-02 LAB — URINALYSIS, ROUTINE W REFLEX MICROSCOPIC
Bilirubin Urine: NEGATIVE
Glucose, UA: NEGATIVE
Hyaline Cast: NONE SEEN /LPF
Ketones, ur: NEGATIVE
Nitrite: NEGATIVE
RBC / HPF: >=60 /HPF — AB (ref 0–2)
Specific Gravity, Urine: 1.025 (ref 1.001–1.035)
pH: 5.5 (ref 5.0–8.0)

## 2020-12-02 LAB — PREGNANCY, URINE: Preg Test, Ur: NEGATIVE

## 2020-12-02 LAB — MICROSCOPIC MESSAGE

## 2020-12-02 NOTE — Progress Notes (Signed)
BP 124/78   Pulse 89   Temp 98.5 F (36.9 C)   Ht 5\' 6"  (1.676 m)   Wt (!) 340 lb 3.2 oz (154.3 kg)   SpO2 98%   BMI 54.91 kg/m    Subjective:    Patient ID: Kristen Kline, female    DOB: 17-Mar-1986, 34 y.o.   MRN: [id number]  HPI: Kristen Kline is a 35 y.o. female presenting on 12/02/2020 for comprehensive medical examination. Current medical complaints include:  URINARY SYMPTOMS Treated for UTI about 1 month ago, symptoms improved but some remain. Duration: days Dysuria: no Urinary frequency: no Urgency: no Small volume voids: yes Symptom severity: no Urinary incontinence: no Foul odor: yes Hematuria: no Abdominal pain: no Back pain: no Suprapubic pain/pressure: yes Flank pain: no Fever:  no Vomiting: no Status: better Previous urinary tract infection: yes Recurrent urinary tract infection: no Sexual activity: Currently sexually active with 1 female partner History of sexually transmitted disease: no Vaginal discharge: no Treatments attempted: treated with Macrobid; does not urinate after sex every time  She has been working on increasing physical activity.  Has 2 kids and dances with daughter, plays basketball with son.  Lifts weights with youtube video.  Walking. Trying to eat more whole foods.  She currently lives with: husband, 12/04/2020 LMP: 11/25/2020  Depression Screen done today and results listed below:  Depression screen [facility] 2/9 11/19/2019 04/16/2018  Decreased Interest 0 0  Down, Depressed, Hopeless 0 0  PHQ - 2 Score 0 0    The patient does not have a history of falls. I did not complete a risk assessment for falls. A plan of care for falls was not documented.   Past Medical History:  Past Medical History:  Diagnosis Date   Acute hypoxemic respiratory failure due to COVID-19 (HCC) 10/09/2019   Anxiety    Hx of cholecystectomy    Morbid obesity (HCC)    UTI (lower urinary tract infection) 05/30/2015    Surgical History:  Past  Surgical History:  Procedure Laterality Date   CESAREAN SECTION     CESAREAN SECTION N/A    [employer] 11/17/2019   CHOLECYSTECTOMY     WISDOM TOOTH EXTRACTION      Medications:  Current Outpatient Medications on File Prior to Visit  Medication Sig   albuterol (VENTOLIN HFA) 108 (90 Base) MCG/ACT inhaler Inhale 2 puffs into the lungs every 4 (four) hours as needed for wheezing or shortness of breath.   ascorbic acid (VITAMIN C) 500 MG tablet Take 1 tablet (500 mg total) by mouth daily.   Cholecalciferol (D3-50) 1.25 MG (50000 UT) capsule TAKE 1 CAPSULE BY MOUTH ONCE A WEEK X 8 WEEKS   cholestyramine light (PREVALITE) 4 g packet Take 1 packet (4 g total) by mouth daily. Take daily with a meal   ferrous sulfate 325 (65 FE) MG tablet Take 1 tablet (325 mg total) by mouth daily with breakfast.   metroNIDAZOLE (METROGEL VAGINAL) 0.75 % vaginal gel Place 1 Applicatorful vaginally at bedtime.   nitrofurantoin, macrocrystal-monohydrate, (MACROBID) 100 MG capsule Take 1 capsule (100 mg total) by mouth 2 (two) times daily.   zinc sulfate 220 (50 Zn) MG capsule Take 1 capsule (220 mg total) by mouth daily.   No current facility-administered medications on file prior to visit.    Allergies:  No Known Allergies  Social History:  Social History   Socioeconomic History   Marital status: Married    Spouse name: Not on file  Number of children: Not on file   Years of education: Not on file   Highest education level: Not on file  Occupational History   Not on file  Tobacco Use   Smoking status: Former    Pack years: 0.00   Smokeless tobacco: Never  Vaping Use   Vaping Use: Never used  Substance and Sexual Activity   Alcohol use: No   Drug use: No   Sexual activity: Yes    Birth control/protection: None  Other Topics Concern   Not on file  Social History Narrative   Not on file   Social Determinants of Health   Financial Resource Strain: Not on file  Food Insecurity: Not on file   Transportation Needs: Not on file  Physical Activity: Not on file  Stress: Not on file  Social Connections: Not on file  Intimate Partner Violence: Not on file   Social History   Tobacco Use  Smoking Status Former   Pack years: 0.00  Smokeless Tobacco Never   Social History   Substance and Sexual Activity  Alcohol Use No    Family History:  Family History  Problem Relation Age of Onset   Heart failure Mother    Diabetes Mother    Hypertension Father    Stroke Paternal Grandfather    Stroke Paternal Grandmother     Past medical history, surgical history, medications, allergies, family history and social history reviewed with patient today and changes made to appropriate areas of the chart.   Review of Systems  Constitutional: Negative.   HENT: Negative.    Eyes: Negative.   Respiratory: Negative.    Cardiovascular: Negative.   Genitourinary:  Negative for dysuria, flank pain, frequency, hematuria and urgency.       + malodorous urine  Musculoskeletal: Negative.   Skin: Negative.   Neurological: Negative.   Psychiatric/Behavioral: Negative.        Objective:    BP 124/78   Pulse 89   Temp 98.5 F (36.9 C)   Ht 5\' 6"  (1.676 m)   Wt (!) 340 lb 3.2 oz (154.3 kg)   SpO2 98%   BMI 54.91 kg/m   Wt Readings from Last 3 Encounters:  12/02/20 (!) 340 lb 3.2 oz (154.3 kg)  11/02/20 (!) 341 lb 3.2 oz (154.8 kg)  12/16/19 (!) 313 lb 3.2 oz (142.1 kg)    Physical Exam Vitals and nursing note reviewed.  Constitutional:      General: She is not in acute distress.    Appearance: Normal appearance. She is obese. She is not toxic-appearing.  HENT:     Head: Normocephalic and atraumatic.     Right Ear: Tympanic membrane, ear canal and external ear normal.     Left Ear: Tympanic membrane, ear canal and external ear normal.     Nose: Nose normal. No congestion.     Mouth/Throat:     Mouth: Mucous membranes are moist.     Pharynx: Oropharynx is clear. No  oropharyngeal exudate or posterior oropharyngeal erythema.  Eyes:     General: No scleral icterus.       Right eye: No discharge.        Left eye: No discharge.     Extraocular Movements: Extraocular movements intact.     Pupils: Pupils are equal, round, and reactive to light.  Neck:     Vascular: No carotid bruit.  Cardiovascular:     Rate and Rhythm: Normal rate and regular rhythm.  Heart sounds: Normal heart sounds. No murmur heard. Pulmonary:     Effort: Pulmonary effort is normal. No respiratory distress.     Breath sounds: Normal breath sounds. No wheezing, rhonchi or rales.  Abdominal:     General: Abdomen is flat. Bowel sounds are normal. There is no distension.     Palpations: Abdomen is soft. There is no mass.     Tenderness: There is no abdominal tenderness. There is no right CVA tenderness or left CVA tenderness.  Genitourinary:    Comments: Deferred secondary to menses Musculoskeletal:        General: Normal range of motion.     Cervical back: Normal range of motion and neck supple. No rigidity.     Right lower leg: No edema.     Left lower leg: No edema.  Lymphadenopathy:     Cervical: No cervical adenopathy.  Skin:    General: Skin is warm and dry.     Capillary Refill: Capillary refill takes less than 2 seconds.     Coloration: Skin is not jaundiced or pale.     Findings: No erythema.  Neurological:     General: No focal deficit present.     Mental Status: She is alert and oriented to person, place, and time.     Motor: No weakness.     Gait: Gait normal.  Psychiatric:        Mood and Affect: Mood normal.        Behavior: Behavior normal.        Thought Content: Thought content normal.        Judgment: Judgment normal.    Results for orders placed or performed in visit on 12/02/20  Urinalysis, Routine w reflex microscopic  Result Value Ref Range   Color, Urine AMBER (A) YELLOW   APPearance CLOUDY (A) CLEAR   Specific Gravity, Urine 1.025 1.001 -  1.035   pH 5.5 5.0 - 8.0   Glucose, UA NEGATIVE NEGATIVE   Bilirubin Urine NEGATIVE NEGATIVE   Ketones, ur NEGATIVE NEGATIVE   Hgb urine dipstick 3+ (A) NEGATIVE   Protein, ur 1+ (A) NEGATIVE   Nitrite NEGATIVE NEGATIVE   Leukocytes,Ua 1+ (A) NEGATIVE   WBC, UA 6-10 (A) 0 - 5 /HPF   RBC / HPF > OR = 60 (A) 0 - 2 /HPF   Squamous Epithelial / LPF 6-10 (A) < OR = 5 /HPF   Bacteria, UA FEW (A) NONE SEEN /HPF   Hyaline Cast NONE SEEN NONE SEEN /LPF  Pregnancy, urine  Result Value Ref Range   Preg Test, Ur NEGATIVE NEGATIVE  Microscopic Message  Result Value Ref Range   Note        Assessment & Plan:  1. Annual physical exam Reviewed labs with patient from last visit.  Discussed that A1c is in prediabetes range and recommended decreasing amount of simple sugars and carbohydrates in diet including breads, pastas, sweets, sodas.  Discussed elevated cholesterol and how to reduce this including limiting saturated fats found in red meats, full fat dairy products, and fried foods.  Plan to follow-up on labs in November to recheck things.  2. Malodorous urine Acute.  Urinalysis today showed 3+ blood, 1+ protein, 1+ leukocyte esterase, 6-10 white blood cells, greater than 60 red blood cells, epithelial cells and few bacteria.  We will send urine for culture.  We recently treated with Macrobid for an acute UTI and I want to ensure this is gone.  The hematuria is  likely related to her menses.  - Urinalysis, Routine w reflex microscopic - Urine Culture  3. Spotting Pregnancy test negative today.  Spotting likely due to recent increase in physical activity.  Continue iron supplementation for heavy periods.  - Pregnancy, urine  4. Need for tetanus, diphtheria, and acellular pertussis (Tdap) vaccine - Tdap vaccine greater than or equal to 7yo IM  5. Morbid obesity with BMI of 50.0-59.9, adult (HCC) Discussed dietary and lifestyle changes.  Goal of physical activity is 30 minutes 5 times weekly.   Encouraged low-fat, low sugar/carbohydrate diet.    Follow up plan: Return for pap smear.   LABORATORY TESTING:  - Pap smear:  patient to scheduled after menses  IMMUNIZATIONS:   - Tdap: Tetanus vaccination status reviewed: Tdap administered today. - Influenza: Postponed to flu season - Pneumovax: Not applicable - Prevnar: Not applicable - HPV: Not applicable - Zostavax vaccine: Not applicable - COVID-19 vaccine: fully vaccinated, plans to get booster  SCREENING: -Mammogram: Not applicable  - Colonoscopy: Not applicable  - Bone Density: Not applicable  -Hearing Test: Not applicable  -Spirometry: Not applicable   PATIENT COUNSELING:   Advised to take 1 mg of folate supplement per day if capable of pregnancy.   Sexuality: Discussed sexually transmitted diseases, partner selection, use of condoms, avoidance of unintended pregnancy  and contraceptive alternatives.   Advised to avoid cigarette smoking.  I discussed with the patient that most people either abstain from alcohol or drink within safe limits (<=14/week and <=4 drinks/occasion for males, <=7/weeks and <= 3 drinks/occasion for females) and that the risk for alcohol disorders and other health effects rises proportionally with the number of drinks per week and how often a drinker exceeds daily limits.  Discussed cessation/primary prevention of drug use and availability of treatment for abuse.   Diet: Encouraged to adjust caloric intake to maintain  or achieve ideal body weight, to reduce intake of dietary saturated fat and total fat, to limit sodium intake by avoiding high sodium foods and not adding table salt, and to maintain adequate dietary potassium and calcium preferably from fresh fruits, vegetables, and low-fat dairy products.    stressed the importance of regular exercise  Injury prevention: Discussed safety belts, safety helmets, smoke detector, smoking near bedding or upholstery.   Dental health: Discussed  importance of regular tooth brushing, flossing, and dental visits.    NEXT PREVENTATIVE PHYSICAL DUE IN 1 YEAR. Return for pap smear.

## 2020-12-03 LAB — URINE CULTURE
MICRO NUMBER:: 12047660
SPECIMEN QUALITY:: ADEQUATE

## 2020-12-05 ENCOUNTER — Encounter: Payer: Self-pay | Admitting: Nurse Practitioner

## 2020-12-16 ENCOUNTER — Ambulatory Visit: Payer: Medicaid Other | Admitting: Nurse Practitioner

## 2020-12-16 ENCOUNTER — Other Ambulatory Visit: Payer: Self-pay

## 2020-12-16 ENCOUNTER — Encounter: Payer: Self-pay | Admitting: Nurse Practitioner

## 2020-12-16 VITALS — BP 112/76 | HR 93 | Temp 98.6°F | Ht 66.0 in | Wt 340.6 lb

## 2020-12-16 DIAGNOSIS — N921 Excessive and frequent menstruation with irregular cycle: Secondary | ICD-10-CM

## 2020-12-16 DIAGNOSIS — N926 Irregular menstruation, unspecified: Secondary | ICD-10-CM

## 2020-12-16 LAB — CBC WITH DIFFERENTIAL/PLATELET
Absolute Monocytes: 509 cells/uL (ref 200–950)
Basophils Absolute: 54 cells/uL (ref 0–200)
Basophils Relative: 0.4 %
Eosinophils Absolute: 335 cells/uL (ref 15–500)
Eosinophils Relative: 2.5 %
HCT: 32.7 % — ABNORMAL LOW (ref 35.0–45.0)
Hemoglobin: 10.7 g/dL — ABNORMAL LOW (ref 11.7–15.5)
Lymphs Abs: 3417 cells/uL (ref 850–3900)
MCH: 27.2 pg (ref 27.0–33.0)
MCHC: 32.7 g/dL (ref 32.0–36.0)
MCV: 83 fL (ref 80.0–100.0)
MPV: 8.4 fL (ref 7.5–12.5)
Monocytes Relative: 3.8 %
Neutro Abs: 9085 cells/uL — ABNORMAL HIGH (ref 1500–7800)
Neutrophils Relative %: 67.8 %
Platelets: 444 10*3/uL — ABNORMAL HIGH (ref 140–400)
RBC: 3.94 10*6/uL (ref 3.80–5.10)
RDW: 15 % (ref 11.0–15.0)
Total Lymphocyte: 25.5 %
WBC: 13.4 10*3/uL — ABNORMAL HIGH (ref 3.8–10.8)

## 2020-12-16 MED ORDER — NORETHINDRONE 0.35 MG PO TABS: 1.0000 | ORAL_TABLET | Freq: Every day | ORAL | 2 refills | Status: DC

## 2020-12-16 NOTE — Progress Notes (Signed)
Subjective:    Patient ID: Kristen Kline, female    DOB: Dec 24, 1985, 35 y.o.   MRN: [id number]  HPI: Kristen Kline is a 35 y.o. female presenting for  Chief Complaint  Patient presents with   Menorrhagia    3 wks, cramping and clotting. Want to discuss birth control for heavy periods   ABNORMAL MENSTRUAL PERIODS [id number] Patient reports her menses is going on 3 weeks.  She would like to start something to stop the bleeding- it is severe and bothersome.  Of note, husband wants to have a child and they are not preventing pregnancy.   Duration: weeks Average interval between menses: 28 days Length of menses: 4 days Flow: middle day is most heavy Dysmenorrhea: yes Intermenstrual bleeding: no Postcoital bleeding: no Contraception: none currently, had Nexplanon in the past Menarche at age: 40 Sexual activity: currently sexually active with 1 female partner History of sexually transmitted diseases: no History GYN procedures: no Abnormal pap smears: no   Dyspareunia: no Vaginal discharge:no Abdominal pain: no Galactorrhea: no Hirsuitism: yes, some on chin and abdomen Frequent bruising/mucosal bleeding: no Double vision:no Hot flashes: no History of migraines: no  No Known Allergies  Outpatient Encounter Medications as of 12/16/2020  Medication Sig   albuterol (VENTOLIN HFA) 108 (90 Base) MCG/ACT inhaler Inhale 2 puffs into the lungs every 4 (four) hours as needed for wheezing or shortness of breath.   Cholecalciferol (D3-50) 1.25 MG (50000 UT) capsule TAKE 1 CAPSULE BY MOUTH ONCE A WEEK X 8 WEEKS   cholestyramine light (PREVALITE) 4 g packet Take 1 packet (4 g total) by mouth daily. Take daily with a meal   ferrous sulfate 325 (65 FE) MG tablet Take 1 tablet (325 mg total) by mouth daily with breakfast.   norethindrone (ORTHO MICRONOR) 0.35 MG tablet Take 1 tablet (0.35 mg total) by mouth daily.   zinc sulfate 220 (50 Zn) MG capsule Take 1 capsule (220 mg total)  by mouth daily.   ascorbic acid (VITAMIN C) 500 MG tablet Take 1 tablet (500 mg total) by mouth daily. (Patient not taking: Reported on 12/16/2020)   [DISCONTINUED] metroNIDAZOLE (METROGEL VAGINAL) 0.75 % vaginal gel Place 1 Applicatorful vaginally at bedtime.   [DISCONTINUED] nitrofurantoin, macrocrystal-monohydrate, (MACROBID) 100 MG capsule Take 1 capsule (100 mg total) by mouth 2 (two) times daily.   No facility-administered encounter medications on file as of 12/16/2020.    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 Interim medical history since our last visit reviewed.  Review of Systems Per HPI unless specifically indicated above     Objective:    BP 112/76   Pulse 93   Temp 98.6 F (37 C)   Ht 5\' 6"  (1.676 m)   Wt (!) 340 lb 9.6 oz (154.5 kg)   SpO2 99%   BMI 54.97 kg/m   Wt Readings from Last 3 Encounters:  12/16/20 (!) 340 lb 9.6  oz (154.5 kg)  12/02/20 (!) 340 lb 3.2 oz (154.3 kg)  11/02/20 (!) 341 lb 3.2 oz (154.8 kg)    Physical Exam Constitutional:      General: She is not in acute distress.    Appearance: Normal appearance. She is obese. She is not toxic-appearing.  HENT:     Head: Normocephalic and atraumatic.  Eyes:     General: No scleral icterus.    Extraocular Movements: Extraocular movements intact.  Cardiovascular:     Rate and Rhythm: Normal rate.  Pulmonary:     Effort: Pulmonary effort is normal. No  respiratory distress.  Abdominal:     General: Abdomen is flat.     Palpations: Abdomen is soft.  Genitourinary:    Comments: Deferred secondary to bleeding Musculoskeletal:        General: Normal range of motion.     Right lower leg: No edema.     Left lower leg: No edema.  Skin:    General: Skin is warm and dry.     Capillary Refill: Capillary refill takes less than 2 seconds.     Coloration: Skin is not jaundiced or pale.     Findings: No erythema.  Neurological:     Mental Status: She is alert and oriented to person, place, and time.     Motor: No weakness.     Gait: Gait normal.  Psychiatric:        Mood and Affect: Mood normal.        Behavior: Behavior normal.        Thought Content: Thought content normal.        Judgment: Judgment normal.      Assessment & Plan:  1. Abnormal menses Acute.  Unclear etiology - has risk factors for PCOS including some hair on chin and obesity.  Will defer work up to [employer] and place referral today.  In meantime, check blood counts to ensure that she is not anemic and start progesterone only pill to hopefully help regulate cycle.  Desires pregnancy with her husband, so this will just be used short term until bleeding is controlled.    - CBC with Differential - norethindrone (ORTHO MICRONOR) 0.35 MG tablet; Take 1 tablet (0.35 mg total) by mouth daily.  Dispense: 28 tablet; Refill: 2 - Ambulatory referral to Obstetrics / Gynecology  2. Menorrhagia with irregular cycle Acute.  Unclear etiology - has risk factors for PCOS including some hair on chin and obesity.  Will defer work up to [employer] and place referral today.  In meantime, check blood counts to ensure that she is not anemic and start progesterone only pill to hopefully help regulate cycle.  Desires pregnancy with her husband, so this will just be used short term until bleeding is controlled.    - norethindrone (ORTHO MICRONOR) 0.35 MG tablet; Take 1 tablet (0.35 mg total) by mouth daily.   Dispense: 28 tablet; Refill: 2 - Ambulatory referral to Obstetrics / Gynecology     Follow up plan: Return in about 4 weeks (around 01/13/2021).

## 2020-12-21 ENCOUNTER — Encounter: Payer: Self-pay | Admitting: Nurse Practitioner

## 2020-12-22 MED ORDER — AMOXICILLIN-POT CLAVULANATE 875-125 MG PO TABS: 1.0000 | ORAL_TABLET | Freq: Two times a day (BID) | ORAL | 0 refills | Status: AC

## 2021-01-10 ENCOUNTER — Encounter: Payer: Medicaid Other | Admitting: Adult Health

## 2021-01-23 ENCOUNTER — Ambulatory Visit
Admission: EM | Admit: 2021-01-23 | Discharge: 2021-01-23 | Disposition: A | Discharge status: Home / Self Care | Payer: Medicaid Other | Attending: Family Medicine | Admitting: Family Medicine

## 2021-01-23 ENCOUNTER — Encounter: Payer: Self-pay | Admitting: Emergency Medicine

## 2021-01-23 ENCOUNTER — Other Ambulatory Visit: Payer: Self-pay

## 2021-01-23 DIAGNOSIS — U071 COVID-19: Secondary | ICD-10-CM | POA: Diagnosis not present

## 2021-01-23 MED ORDER — CETIRIZINE HCL 10 MG PO TABS: 10.0000 mg | ORAL_TABLET | Freq: Every day | ORAL | 0 refills | Status: DC

## 2021-01-23 MED ORDER — NIRMATRELVIR/RITONAVIR (PAXLOVID)TABLET: 3.0000 | ORAL_TABLET | Freq: Two times a day (BID) | ORAL | 0 refills | Status: AC

## 2021-01-23 MED ORDER — PROMETHAZINE-DM 6.25-15 MG/5ML PO SYRP: 5.0000 mL | ORAL_SOLUTION | Freq: Four times a day (QID) | ORAL | 0 refills | Status: DC | PRN

## 2021-01-23 MED ORDER — ALBUTEROL SULFATE HFA 108 (90 BASE) MCG/ACT IN AERS
2.0000 | INHALATION_SPRAY | Freq: Four times a day (QID) | RESPIRATORY_TRACT | Status: DC
  Administered 2021-01-23: 2 via RESPIRATORY_TRACT

## 2021-01-23 NOTE — ED Provider Notes (Signed)
[facility]    CSN: [id number] Arrival date & time: 01/23/21  9622      History   Chief Complaint Chief Complaint  Patient presents with   Covid+   Cough   Fever    HPI Kristen Kline is a 35 y.o. female.   HPI Patient presents today following testing positive for COVID yesterday.  She complains of symptoms of cough, body aches, chills, congestion.  Patient has had COVID previously which resulted in acute respiratory failure. She is now fully vaccinated.  Endorses some mild wheezing although no severe shortness of breath.  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 Authorizing Provider  cetirizine (ZYRTEC) 10 MG tablet Take 1 tablet (10 mg total) by mouth daily. 01/23/21  Yes Bing Neighbors, FNP  nirmatrelvir/ritonavir EUA (PAXLOVID) TABS Take 3 tablets by mouth 2 (two) times daily for 5 days. Patient GFR is > 60 (normal). Take nirmatrelvir (150 mg) two tablets twice daily for 5 days and ritonavir (100 mg) one tablet twice daily for 5 days. 01/23/21 01/28/21 Yes  Bing Neighbors, FNP  promethazine-dextromethorphan (PROMETHAZINE-DM) 6.25-15 MG/5ML syrup Take 5 mLs by mouth 4 (four) times daily as needed for cough. 01/23/21  Yes Bing Neighbors, FNP  albuterol (VENTOLIN HFA) 108 (90 Base) MCG/ACT inhaler Inhale 2 puffs into the lungs every 4 (four) hours as needed for wheezing or shortness of breath. 01/25/20   Elmore Guise, FNP  ascorbic acid (VITAMIN C) 500 MG tablet Take 1 tablet (500 mg total) by mouth daily. Patient not taking: Reported on 12/16/2020 10/14/19   Burnadette Pop, MD  Cholecalciferol (D3-50) 1.25 MG (50000 UT) capsule TAKE 1 CAPSULE BY MOUTH ONCE A WEEK X 8 WEEKS 01/11/20   Bates, Crystal A, FNP  cholestyramine light (PREVALITE) 4 g packet Take 1 packet (4 g total) by mouth daily. Take daily with a meal 12/01/20   Valentino Nose, NP  ferrous sulfate 325 (65 FE) MG tablet Take 1 tablet (325 mg total) by mouth daily with breakfast. 10/14/19   Burnadette Pop, MD  norethindrone (ORTHO MICRONOR) 0.35 MG tablet Take 1 tablet (0.35 mg total) by mouth daily. 12/16/20   Valentino Nose, NP  zinc sulfate 220 (50 Zn) MG capsule Take 1 capsule (220 mg total) by mouth daily. 10/14/19   Burnadette Pop, MD    Family History Family History  Problem Relation Age of Onset   Heart failure Mother    Diabetes Mother    Hypertension Father    Stroke Paternal Grandfather    Stroke Paternal Grandmother     Social History Social History   Tobacco Use   Smoking status: Former   Smokeless tobacco: Never  Building services engineer Use: Never used  Substance Use Topics   Alcohol use: No   Drug use: No     Allergies   Patient has no known allergies.   Review of Systems Review of Systems Pertinent negatives listed in HPI   Physical Exam Triage Vital Signs ED Triage Vitals [01/23/21 1034]  Enc Vitals Group     BP 111/64     Pulse Rate 90     Resp 20     Temp 98.5 F (36.9 C)     Temp Source Oral     SpO2 96 %     Weight      Height       Head Circumference      Peak Flow      Pain Score 4     Pain Loc      Pain Edu?      Excl. in GC?    No data found.  Updated Vital Signs BP 111/64 (BP Location: Right Arm)   Pulse 90   Temp 98.5 F (36.9 C) (Oral)   Resp 20   SpO2 96%   Visual Acuity Right Eye Distance:   Left Eye Distance:   Bilateral Distance:    Right Eye Near:   Left Eye Near:    Bilateral Near:     Physical Exam  General Appearance:    Alert, cooperative, no distress  HENT:   Normocephalic, ears normal, nares mucosal edema with congestion, rhinorrhea, oropharynx non erythematous w/o exudate   Eyes:    PERRL, conjunctiva/corneas clear, EOM's intact       Lungs:     Clear to auscultation bilaterally, respirations unlabored  Heart:    Regular rate and rhythm  Neurologic:   Awake, alert, oriented x 3. No apparent focal neurological           defect.      UC Treatments / Results  Labs (all labs ordered are listed, but only abnormal results are displayed) Labs Reviewed - No data to display  EKG   Radiology No results found.  Procedures Procedures (including critical care time)  Medications Ordered in UC Medications - No data to display  Initial Impression / Assessment and Plan / UC Course  I have reviewed the triage vital signs and the nursing notes.  Pertinent labs & imaging results that were available during my care of the patient were reviewed by me and considered in my medical decision making (see chart for details).    Patient with a history of severe respiratory failure secondary to COVID-19 infection presents following a positive home COVID test with active COVID related symptoms. Antiviral therapy initiated. Red flag precautions given    Final Clinical Impressions(s) / UC Diagnoses   Final diagnoses:  COVID-19 virus infection     Discharge Instructions      Take medication as prescribed.  Avoid taking any oral contraceptives while taking antiviral therapy as there  is a severe adverse effect.  Once you are done with antiviral she can resume taking oral contraceptives.  Use any barrier protection if you are sexually active to prevent pregnancy. Use albuterol inhaler 2 puffs every 4-6 hours as needed for shortness of breath, wheezing. Cetirizine take daily to help with nasal symptoms and Promethazine DM as needed for cough and cough     ED Prescriptions     Medication Sig Dispense Auth. Provider   nirmatrelvir/ritonavir EUA (PAXLOVID) TABS Take 3 tablets by mouth 2 (two) times daily for 5 days. Patient GFR is > 60 (normal). Take nirmatrelvir (150 mg) two tablets twice daily for 5 days and ritonavir (100 mg) one tablet twice daily for 5 days. 30 tablet Bing Neighbors, FNP   promethazine-dextromethorphan (PROMETHAZINE-DM) 6.25-15 MG/5ML syrup Take 5 mLs by mouth 4 (four) times daily as needed for cough. 240 mL Bing Neighbors, FNP   cetirizine (ZYRTEC) 10 MG tablet Take 1 tablet (10 mg total) by mouth daily. 30 tablet Bing Neighbors, FNP      PDMP not reviewed this encounter.   Bing Neighbors, FNP 01/27/21 1421

## 2021-01-23 NOTE — Discharge Instructions (Addendum)
Take medication as prescribed.  Avoid taking any oral contraceptives while taking antiviral therapy as there is a severe adverse effect.  Once you are done with antiviral she can resume taking oral contraceptives.  Use any barrier protection if you are sexually active to prevent pregnancy. Use albuterol inhaler 2 puffs every 4-6 hours as needed for shortness of breath, wheezing. Cetirizine take daily to help with nasal symptoms and Promethazine DM as needed for cough and cough

## 2021-01-23 NOTE — ED Triage Notes (Signed)
Pt presents today with c/o of cough and body aches/chills x4 days. Home Covid test taken yesterday, positive.

## 2021-10-23 ENCOUNTER — Other Ambulatory Visit: Payer: Self-pay | Admitting: Nurse Practitioner

## 2021-10-23 DIAGNOSIS — N926 Irregular menstruation, unspecified: Secondary | ICD-10-CM

## 2021-10-23 DIAGNOSIS — N921 Excessive and frequent menstruation with irregular cycle: Secondary | ICD-10-CM

## 2021-10-24 NOTE — Telephone Encounter (Signed)
Requested medication (s) are due for refill today: yes ? ?Requested medication (s) are on the active medication list: yes ? ?Last refill:  12/16/20 ? ?Future visit scheduled: no ? ?Notes to clinic:  Unable to refill per protocol, appointment needed. ? ? ?  ?Requested Prescriptions  ?Pending Prescriptions Disp Refills  ? norethindrone (MICRONOR) 0.35 MG tablet [Pharmacy Med Name: NORETHINDRONE 0.35 MG TABLET] 28 tablet 2  ?  Sig: TAKE 1 TABLET BY MOUTH EVERY DAY  ?  ? OB/GYN: Contraceptives - Progestins Passed - 10/23/2021 11:36 AM  ?  ?  Passed - Last BP in normal range  ?  BP Readings from Last 1 Encounters:  ?01/23/21 111/64  ?   ?  ?  Passed - Valid encounter within last 12 months  ?  Recent Outpatient Visits   ? ?      ? 10 months ago Abnormal menses  ? [facility] Valentino Nose, NP  ? 10 months ago Annual physical exam  ? [facility] Cathlean Marseilles A, NP  ? 11 months ago Morbid obesity with BMI of 50.0-59.9, adult [facility])  ? [facility] Valentino Nose, NP  ? 1 year ago Tick bite, initial encounter  ? [facility] Jenne Pane, Rocky Crafts, FNP  ? 1 year ago Adult general medical exam  ? [facility] Elmore Guise, FNP  ? ?  ?  ? ? ?  ?  ?  Passed - Patient is not a smoker  ?  ?  ? ? ?

## 2021-11-16 ENCOUNTER — Encounter: Payer: Self-pay | Admitting: Advanced Practice Midwife

## 2021-11-16 ENCOUNTER — Encounter: Payer: Medicaid Other | Admitting: Advanced Practice Midwife

## 2021-11-16 ENCOUNTER — Ambulatory Visit: Payer: Medicaid Other | Admitting: Advanced Practice Midwife

## 2021-11-16 ENCOUNTER — Other Ambulatory Visit (HOSPITAL_COMMUNITY)
Admission: RE | Admit: 2021-11-16 | Discharge: 2021-11-16 | Disposition: A | Discharge status: Home / Self Care | Payer: Medicaid Other | Source: Ambulatory Visit | Attending: Advanced Practice Midwife | Admitting: Advanced Practice Midwife

## 2021-11-16 VITALS — BP 140/94 | HR 97 | Ht 66.0 in | Wt 344.2 lb

## 2021-11-16 DIAGNOSIS — R030 Elevated blood-pressure reading, without diagnosis of hypertension: Secondary | ICD-10-CM | POA: Diagnosis not present

## 2021-11-16 DIAGNOSIS — N921 Excessive and frequent menstruation with irregular cycle: Secondary | ICD-10-CM | POA: Insufficient documentation

## 2021-11-16 DIAGNOSIS — Z113 Encounter for screening for infections with a predominantly sexual mode of transmission: Secondary | ICD-10-CM | POA: Diagnosis not present

## 2021-11-16 DIAGNOSIS — Z124 Encounter for screening for malignant neoplasm of cervix: Secondary | ICD-10-CM | POA: Insufficient documentation

## 2021-11-16 DIAGNOSIS — N926 Irregular menstruation, unspecified: Secondary | ICD-10-CM

## 2021-11-16 MED ORDER — PNV PRENATAL PLUS MULTIVITAMIN 27-1 MG PO TABS: 1.0000 | ORAL_TABLET | Freq: Every day | ORAL | 11 refills | Status: DC

## 2021-11-16 MED ORDER — NORETHINDRONE 0.35 MG PO TABS: 1.0000 | ORAL_TABLET | Freq: Every day | ORAL | 2 refills | Status: DC

## 2021-11-16 NOTE — Progress Notes (Signed)
[facility] Visit  Patient name: Kristen Kline MRN [id number]  Date of birth: 1986/02/27  CC & HPI:  Kristen Kline is a 36 y.o. African American female presenting today for irregular, heavy periods.  Periods have usually been farily regular w/medium flow. Several months ago, she had prolonged bleeding (some times spotty, other times rather heavy).  Started taking POPs just over a month ago, hasn't had any BTB.  However, would like to get pregnant.  Hx anemia. Has never had a pap smear that she knows of (was scared to get one).   Pertinent History Reviewed:  Medical & Surgical Hx:   Past Medical History:  Diagnosis Date   Acute hypoxemic respiratory failure due to COVID-19 (HCC) 10/09/2019   Anxiety    Hx of cholecystectomy    Morbid obesity (HCC)    UTI (lower urinary tract infection) 05/30/2015   Past Surgical History:  Procedure Laterality Date   CESAREAN SECTION     CESAREAN SECTION N/A    [employer] 11/17/2019   CHOLECYSTECTOMY     WISDOM TOOTH EXTRACTION     Family History  Problem Relation Age of Onset   Heart failure Mother    Diabetes Mother    Hypertension Father    Stroke Paternal Grandfather    Stroke Paternal Grandmother     Current Outpatient Medications:    ascorbic acid (VITAMIN C) 500 MG tablet, Take 1 tablet (500 mg total) by mouth daily., Disp: 14 tablet, Rfl: 0   Cholecalciferol (D3-50) 1.25 MG (50000 UT) capsule, TAKE 1 CAPSULE BY MOUTH ONCE A WEEK X 8 WEEKS, Disp: 4 capsule, Rfl: 1   ferrous sulfate 325 (65 FE) MG tablet, Take 1 tablet (325 mg total) by mouth daily with breakfast., Disp: 30 tablet, Rfl: 3   Multiple Vitamin (MULTIVITAMIN) tablet, Take 1 tablet by mouth daily., Disp: , Rfl:    Prenatal Vit-Fe Fumarate-FA (PNV PRENATAL PLUS MULTIVITAMIN) 27-1 MG TABS, Take 1 tablet by mouth daily., Disp: 30 tablet, Rfl: 11   cetirizine (ZYRTEC) 10 MG tablet, Take 1 tablet (10 mg total) by mouth daily. (Patient not taking: Reported on  11/16/2021), Disp: 30 tablet, Rfl: 0   cholestyramine light (PREVALITE) 4 g packet, Take 1 packet (4 g total) by mouth daily. Take daily with a meal (Patient not taking: Reported on 11/16/2021), Disp: 30 each, Rfl: 0   norethindrone (ORTHO MICRONOR) 0.35 MG tablet, Take 1 tablet (0.35 mg total) by mouth daily., Disp: 28 tablet, Rfl: 2   promethazine-dextromethorphan (PROMETHAZINE-DM) 6.25-15 MG/5ML syrup, Take 5 mLs by mouth 4 (four) times daily as needed for cough. (Patient not taking: Reported on 11/16/2021), Disp: 240 mL, Rfl: 0 Social History: Reviewed -  reports that she has quit smoking. She has never used smokeless tobacco.  Review of Systems:   Constitutional: Negative for fever and chills Eyes: Negative for visual disturbances Respiratory: Negative for shortness of breath, dyspnea Cardiovascular: Negative for chest pain or palpitations  Gastrointestinal: Negative for vomiting, diarrhea and constipation; no abdominal pain Genitourinary: Negative for dysuria and urgency, vaginal irritation or itching Musculoskeletal: Negative for back pain, joint pain, myalgias  Neurological: Negative for dizziness and headaches    Objective Findings:    Physical Examination: Vitals:   11/16/21 1053 11/16/21 1059  BP: (!) 138/92 (!) 140/94  Pulse: 97    General appearance - well appearing, and in no distress Mental status - alert, oriented to person, place, and time Chest:  Normal respiratory effort Heart -  normal rate and regular rhythm Abdomen:  Soft, nontender Pelvic: SSE:  thin white discharge, no odor.  Cx sl friable. Pap collected, infection swab collected.  Musculoskeletal:  Normal range of motion without pain Extremities:  No edema    No results found for this or any previous visit (from the past 24 hour(s)).    Assessment & Plan:  A:   DUB, desires pregnancy P:  Stay on POPs for total of 3 months; start using ovulation predictor sticks once finished Start PNV Consider Lysteda  is periods are very heavy after stopping POPs   Return for 3 months mychart video visit med check .  Jacklyn Shell CNM 11/16/2021 11:41 AM

## 2021-11-20 ENCOUNTER — Other Ambulatory Visit: Payer: Self-pay | Admitting: Advanced Practice Midwife

## 2021-11-20 LAB — CYTOLOGY - PAP
Comment: NEGATIVE
Diagnosis: UNDETERMINED — AB
High risk HPV: NEGATIVE

## 2021-11-20 LAB — CERVICOVAGINAL ANCILLARY ONLY
Bacterial Vaginitis (gardnerella): POSITIVE — AB
Candida Glabrata: NEGATIVE
Candida Vaginitis: NEGATIVE
Chlamydia: NEGATIVE
Comment: NEGATIVE
Comment: NEGATIVE
Comment: NEGATIVE
Comment: NEGATIVE
Comment: NEGATIVE
Comment: NORMAL
Neisseria Gonorrhea: NEGATIVE
Trichomonas: NEGATIVE

## 2021-11-20 MED ORDER — METRONIDAZOLE 500 MG PO TABS: 500.0000 mg | ORAL_TABLET | Freq: Two times a day (BID) | ORAL | 0 refills | Status: DC

## 2021-12-07 ENCOUNTER — Encounter: Payer: Self-pay | Admitting: Nurse Practitioner

## 2021-12-07 ENCOUNTER — Ambulatory Visit: Payer: Medicaid Other | Admitting: Nurse Practitioner

## 2021-12-07 VITALS — BP 112/83 | HR 89 | Temp 97.0°F | Ht 66.0 in | Wt 340.4 lb

## 2021-12-07 DIAGNOSIS — N921 Excessive and frequent menstruation with irregular cycle: Secondary | ICD-10-CM

## 2021-12-07 DIAGNOSIS — R7303 Prediabetes: Secondary | ICD-10-CM | POA: Diagnosis not present

## 2021-12-07 DIAGNOSIS — R5382 Chronic fatigue, unspecified: Secondary | ICD-10-CM | POA: Diagnosis not present

## 2021-12-07 DIAGNOSIS — E7841 Elevated Lipoprotein(a): Secondary | ICD-10-CM

## 2021-12-07 NOTE — Patient Instructions (Signed)
Check out [employer]itness[employer]

## 2021-12-07 NOTE — Progress Notes (Signed)
Subjective:    Patient ID: Kristen Kline, female    DOB: 02-06-1986, 36 y.o.   MRN: [id number]  HPI  Patient here to establish care.  Pt went to gyn. Periods were really heavy, that's why she started birth control pills.  Patient states that periods are little bit better on birth control but she still is very tired.    Patient interested in having another child and is working with GYN to increase her odds of being pregnant.  Patient would like to know how to lose weight.  Patient states that she has stopped drinking sodas.  However she realizes that some of her eating habits may be related to have her family eats foods.  Patient states that she does like breads and she does eat out a lot.  Patient states that she occasionally works out with her children playing basketball or walking.  Patient has no other concerns today.  Review of Systems  Constitutional:  Positive for fatigue.       Objective:   Physical Exam Vitals reviewed.  Constitutional:      General: She is not in acute distress.    Appearance: Normal appearance. She is obese. She is not ill-appearing, toxic-appearing or diaphoretic.     Comments: Central obesity  HENT:     Head: Normocephalic and atraumatic.  Cardiovascular:     Rate and Rhythm: Normal rate and regular rhythm.     Pulses: Normal pulses.     Heart sounds: Normal heart sounds. No murmur heard. Pulmonary:     Effort: Pulmonary effort is normal. No respiratory distress.     Breath sounds: Normal breath sounds. No wheezing.  Musculoskeletal:     Comments: Grossly intact  Skin:    General: Skin is warm.     Capillary Refill: Capillary refill takes less than 2 seconds.     Comments: Hirsutism noted to chin  Neurological:     Mental Status: She is alert.     Comments: Grossly intact  Psychiatric:        Mood and Affect: Mood normal.        Behavior: Behavior normal.        Assessment & Plan:   1. Chronic fatigue -Unsure of etiology  however will rule out thyroid etiologies of anemia. - TSH + free T4 - CBC with Differential -Return to clinic in 3 months  2. Menorrhagia with irregular cycle -Patient to continue on birth control medication started by GYN -We will evaluate CBC and iron levels. - CBC with Differential - Fe+TIBC+Fer  3. Prediabetes - CMP14+EGFR - Hemoglobin A1c - Amb Ref to Medical Weight Management -May consider starting patient on metformin if A1c elevated as I believe this will help with potential weight loss and management of possible PCOS  4. Elevated lipoprotein(a) - Lipid Profile - Amb Ref to Medical Weight Management  5. Morbid obesity ([place]) -Suspect PCOS. -Patient encouraged to download my fitness pal, read nutrition labels to get a fellow how much she actually eats.  After she figures out how much that she eats I recommend patient make realistic goals to decrease portion sizes and increase exercise. -Return to clinic 3 months    Note:  This document was prepared using Dragon voice recognition software and may include unintentional dictation errors. Note - This record has been created using [employer].  Chart creation errors have been sought, but may not always  have been located. Such creation errors do not reflect on  the standard of medical care.

## 2021-12-25 ENCOUNTER — Encounter: Payer: Medicaid Other | Admitting: Nutrition

## 2021-12-25 ENCOUNTER — Telehealth: Payer: Self-pay | Admitting: Nutrition

## 2021-12-25 NOTE — Telephone Encounter (Signed)
No vm available

## 2022-01-17 ENCOUNTER — Encounter (INDEPENDENT_AMBULATORY_CARE_PROVIDER_SITE_OTHER): Payer: Self-pay

## 2022-03-08 ENCOUNTER — Ambulatory Visit: Payer: Medicaid Other | Admitting: Nurse Practitioner

## 2022-04-26 ENCOUNTER — Ambulatory Visit
Admission: EM | Admit: 2022-04-26 | Discharge: 2022-04-26 | Disposition: A | Discharge status: Home / Self Care | Payer: Medicaid Other | Attending: Nurse Practitioner | Admitting: Nurse Practitioner

## 2022-04-26 DIAGNOSIS — U071 COVID-19: Secondary | ICD-10-CM | POA: Diagnosis not present

## 2022-04-26 DIAGNOSIS — N939 Abnormal uterine and vaginal bleeding, unspecified: Secondary | ICD-10-CM | POA: Diagnosis not present

## 2022-04-26 MED ORDER — PROMETHAZINE-DM 6.25-15 MG/5ML PO SYRP: 5.0000 mL | ORAL_SOLUTION | Freq: Four times a day (QID) | ORAL | 0 refills | Status: DC | PRN

## 2022-04-26 MED ORDER — ALBUTEROL SULFATE HFA 108 (90 BASE) MCG/ACT IN AERS: 2.0000 | INHALATION_SPRAY | Freq: Four times a day (QID) | RESPIRATORY_TRACT | 0 refills | Status: DC | PRN

## 2022-04-26 MED ORDER — MOLNUPIRAVIR EUA 200MG CAPSULE: 4.0000 | ORAL_CAPSULE | Freq: Two times a day (BID) | ORAL | 0 refills | Status: AC

## 2022-04-26 MED ORDER — FLUTICASONE PROPIONATE 50 MCG/ACT NA SUSP: 2.0000 | Freq: Every day | NASAL | 0 refills | Status: DC

## 2022-04-26 MED ORDER — TRANEXAMIC ACID 650 MG PO TABS: 1300.0000 mg | ORAL_TABLET | Freq: Three times a day (TID) | ORAL | 0 refills | Status: AC

## 2022-04-26 NOTE — Discharge Instructions (Addendum)
Take medication as prescribed. Increase fluids and allow for plenty of rest. Remain in isolation until you have completed the antiviral therapy.  If you continue to have symptoms after completing the antiviral treatment, continue to wear your mask for an additional 5 days. Continue ibuprofen or Tylenol as needed for pain, fever, or general discomfort. If you experience worsening shortness of breath, difficulty breathing, wheezing, or other concerns, please go to the emergency department immediately.  For your vaginal bleeding, take medication as prescribed. Recommend following up with your gynecologist as soon as your COVID symptoms resolve for further treatment of your abnormal vaginal bleeding and continued work-up for PCOS. Go to the emergency department if you experience heavier vaginal bleeding, abdominal pain, lightheadedness, dizziness, or other concerns. Follow-up as needed.

## 2022-04-26 NOTE — ED Triage Notes (Signed)
Pt c/o cough and fever since yesterday. States had a positive home COVID test. States took ibuprofen around 2pm.  Pt c/o irregular menstrual cycle, on 2wk. States this week its heavy going through a pad an hour.

## 2022-04-26 NOTE — ED Provider Notes (Signed)
[facility]    CSN: [id number] Arrival date & time: 04/26/22  1827      History   Chief Complaint Chief Complaint  Patient presents with   Cough   Fever   Covid Positive    HPI Kristen Kline is a 36 y.o. female.   The history is provided by the patient.   Patient presents for complaints of cough, fatigue, nasal congestion, sore throat, fever.  Patient states that her symptoms started 1 day ago.  She states she took a home COVID test today, which was positive.  She also states that she has had some intermittent shortness of breath.  She states that she has had COVID in the past, prior to the vaccines coming out.  She states she has been using ibuprofen for her symptoms.  She also complains of an irregular menstrual cycle with abnormal vaginal bleeding that is been going on for the past 2 weeks.  Patient states that she is working with her gynecologist to determine if she has PCOS.  She states that she was recently on birth control to see if this will stop her cycle, but she stopped the medicine because she was trying to get pregnant.  She has not reached out to her gynecologist since her symptoms started.  She reports that she is saturating a pad an hour at this time.  She denies fever, chills, abdominal pain, lightheadedness, dizziness, abdominal cramping, or vaginal discharge. 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 Authorizing Provider  albuterol (VENTOLIN HFA) 108 (90 Base) MCG/ACT inhaler Inhale 2 puffs into the lungs every 6 (six) hours as needed for wheezing or shortness of breath. 04/26/22  Yes Suhan Paci-Warren, Sadie Haber, NP  fluticasone (FLONASE) 50 MCG/ACT nasal spray Place 2 sprays into both nostrils daily. 04/26/22  Yes Harland Aguiniga-Warren, Sadie Haber, NP  molnupiravir EUA (LAGEVRIO) 200 mg CAPS capsule Take 4 capsules (800 mg total) by mouth 2 (two) times daily for 5 days. 04/26/22 05/01/22 Yes Raetta Agostinelli-Warren, Sadie Haber, NP  promethazine-dextromethorphan (PROMETHAZINE-DM) 6.25-15 MG/5ML syrup Take 5 mLs by mouth 4 (four) times daily as needed for cough. 04/26/22  Yes Adianna Darwin-Warren, Sadie Haberhristie J, NP  tranexamic acid (LYSTEDA) 650 MG TABS tablet Take 2 tablets (1,300 mg total) by mouth 3 (three) times daily for 5 days. 04/26/22 05/01/22 Yes Nakoma Gotwalt-Warren, Sadie Haberhristie J, NP  ascorbic acid (VITAMIN C) 500 MG tablet Take 1 tablet (500 mg total) by mouth daily. 10/14/19   Burnadette PopAdhikari, Amrit, MD  cetirizine (ZYRTEC) 10 MG tablet Take 1 tablet (10 mg total) by mouth daily. Patient not taking: Reported on 11/16/2021 01/23/21   Bing NeighborsHarris, Kimberly S, FNP  cholestyramine light (PREVALITE) 4 g packet Take 1 packet (4 g total) by mouth daily. Take daily with a meal Patient not taking: Reported on 11/16/2021 12/01/20   Valentino NoseMartinez, Jessica A, NP  ferrous sulfate 325 (65 FE) MG tablet Take 1 tablet (325 mg total) by mouth daily with breakfast. 10/14/19   Burnadette PopAdhikari, Amrit, MD  metroNIDAZOLE (FLAGYL) 500 MG  tablet Take 1 tablet (500 mg total) by mouth 2 (two) times daily. 11/20/21   Cresenzo-Dishmon, Scarlette CalicoFrances, CNM  Multiple Vitamin (MULTIVITAMIN) tablet Take 1 tablet by mouth daily.    [provider]  norethindrone (ORTHO MICRONOR) 0.35 MG tablet Take 1 tablet (0.35 mg total) by mouth daily. 11/16/21   Cresenzo-Dishmon, Scarlette CalicoFrances, CNM  Prenatal Vit-Fe Fumarate-FA (PNV PRENATAL PLUS MULTIVITAMIN) 27-1 MG TABS Take 1 tablet by mouth daily. 11/16/21   Cresenzo-Dishmon, Scarlette CalicoFrances, CNM    Family History Family History  Problem Relation Age of Onset   Heart failure Mother    Diabetes Mother    Hypertension Father    Stroke Paternal Grandfather    Stroke Paternal Grandmother     Social History Social History   Tobacco Use   Smoking status: Former   Smokeless tobacco: Never  Building services engineerVaping Use   Vaping Use: Never used  Substance Use Topics   Alcohol use: No   Drug use: No     Allergies   Patient has no known allergies.   Review of Systems Review of Systems Per HPI  Physical Exam Triage Vital Signs ED Triage Vitals [04/26/22 1844]  Enc Vitals Group     BP 135/89     Pulse Rate (!) 125     Resp 18     Temp 100.1 F (37.8 C)     Temp Source Oral     SpO2 96 %     Weight      Height      Head Circumference      Peak Flow      Pain Score 8     Pain Loc      Pain Edu?      Excl. in GC?    No data found.  Updated Vital Signs BP 135/89 (BP Location: Left Arm)   Pulse (!) 125   Temp 100.1 F (37.8 C) (Oral)   Resp 18   LMP 04/12/2022   SpO2 96%   Visual Acuity Right Eye Distance:   Left Eye Distance:   Bilateral Distance:    Right Eye Near:   Left Eye Near:    Bilateral Near:     Physical Exam Vitals and nursing note reviewed.  Constitutional:      General: She is not in acute distress.    Appearance: Normal appearance.  HENT:     Head: Normocephalic.     Right Ear: Tympanic membrane, ear canal and external ear normal.     Left Ear: Tympanic membrane, ear  canal and external ear normal.     Nose: Congestion and rhinorrhea  present.     Right Turbinates: Enlarged and swollen.     Left Turbinates: Enlarged and swollen.     Right Sinus: No maxillary sinus tenderness or frontal sinus tenderness.     Left Sinus: No maxillary sinus tenderness or frontal sinus tenderness.     Mouth/Throat:     Lips: Pink.     Mouth: Mucous membranes are moist.     Pharynx: Uvula midline. Pharyngeal swelling and posterior oropharyngeal erythema present. No oropharyngeal exudate.     Tonsils: No tonsillar exudate. 1+ on the right. 1+ on the left.  Eyes:     Extraocular Movements: Extraocular movements intact.     Pupils: Pupils are equal, round, and reactive to light.  Cardiovascular:     Rate and Rhythm: Normal rate and regular rhythm.     Pulses: Normal pulses.     Heart sounds: Normal heart sounds.  Pulmonary:     Effort: Pulmonary effort is normal. No respiratory distress.     Breath sounds: Normal breath sounds. No stridor. No wheezing, rhonchi or rales.  Abdominal:     General: Bowel sounds are normal.     Palpations: Abdomen is soft.  Musculoskeletal:     Cervical back: Normal range of motion.  Lymphadenopathy:     Cervical: No cervical adenopathy.  Skin:    General: Skin is warm and dry.  Neurological:     General: No focal deficit present.     Mental Status: She is alert and oriented to person, place, and time.  Psychiatric:        Mood and Affect: Mood normal.        Behavior: Behavior normal.      UC Treatments / Results  Labs (all labs ordered are listed, but only abnormal results are displayed) Labs Reviewed - No data to display  EKG   Radiology No results found.  Procedures Procedures (including critical care time)  Medications Ordered in UC Medications - No data to display  Initial Impression / Assessment and Plan / UC Course  I have reviewed the triage vital signs and the nursing notes.  Pertinent labs & imaging results  that were available during my care of the patient were reviewed by me and considered in my medical decision making (see chart for details).  Patient is well-appearing, she is in no acute distress.  She is tachycardic, but O2 sats are stable.  Patient with positive home COVID test.  He also complains of abnormal vaginal bleeding, saturating 1 pad per hour.  Positive self-administered antigen test for COVID-19  Treatment with molnupiravir, fluticasone nasal spray, albuterol inhaler, and Promethazine DM cough syrup. Increase fluids and allow for plenty of rest. Recommend using a humidifier in the bedroom at nighttime during sleep to help with cough.  Also recommend sleeping elevated on pillows while cough symptoms persist. Remain isolated until you have completed the antiviral therapy.  If you continue to exhibit symptoms, continue to wear your mask for an additional 5 days. Strict ER precautions were provided to the patient.  Abnormal vaginal bleeding  Lysteda 650 mg was prescribed for abnormal vaginal bleeding. Follow-up with your gynecologist within the next week for further evaluation. Strict ER precautions were provided to the patient.  Patient verbalizes understanding.  All questions were answered.  Patient is stable for discharge. Final Clinical Impressions(s) / UC Diagnoses   Final diagnoses:  Positive self-administered antigen test for COVID-19  Abnormal vaginal bleeding     Discharge Instructions  Take medication as prescribed. Increase fluids and allow for plenty of rest. Remain in isolation until you have completed the antiviral therapy.  If you continue to have symptoms after completing the antiviral treatment, continue to wear your mask for an additional 5 days. Continue ibuprofen or Tylenol as needed for pain, fever, or general discomfort. If you experience worsening shortness of breath, difficulty breathing, wheezing, or other concerns, please go to the emergency  department immediately.  For your vaginal bleeding, take medication as prescribed. Recommend following up with your gynecologist as soon as your COVID symptoms resolve for further treatment of your abnormal vaginal bleeding and continued work-up for PCOS. Go to the emergency department if you experience heavier vaginal bleeding, abdominal pain, lightheadedness, dizziness, or other concerns. Follow-up as needed.     ED Prescriptions     Medication Sig Dispense Auth. Provider   molnupiravir EUA (LAGEVRIO) 200 mg CAPS capsule Take 4 capsules (800 mg total) by mouth 2 (two) times daily for 5 days. 40 capsule Helia Haese-Warren, Sadie Haber, NP   albuterol (VENTOLIN HFA) 108 (90 Base) MCG/ACT inhaler Inhale 2 puffs into the lungs every 6 (six) hours as needed for wheezing or shortness of breath. 8 g Gedalya Jim-Warren, Sadie Haber, NP   promethazine-dextromethorphan (PROMETHAZINE-DM) 6.25-15 MG/5ML syrup Take 5 mLs by mouth 4 (four) times daily as needed for cough. 118 mL Jaryd Drew-Warren, Sadie Haber, NP   fluticasone (FLONASE) 50 MCG/ACT nasal spray Place 2 sprays into both nostrils daily. 16 g Mearle Drew-Warren, Sadie Haber, NP   tranexamic acid (LYSTEDA) 650 MG TABS tablet Take 2 tablets (1,300 mg total) by mouth 3 (three) times daily for 5 days. 30 tablet Lovina Zuver-Warren, Sadie Haber, NP      PDMP not reviewed this encounter.   Abran Cantor, NP 04/26/22 1925

## 2022-05-25 ENCOUNTER — Telehealth: Payer: Self-pay

## 2022-05-25 NOTE — Telephone Encounter (Signed)
Transition Care Management Unsuccessful Follow-up Telephone Call  Date of discharge and from where:  05/24/22; [facility]   Attempts:  1st Attempt  Reason for unsuccessful TCM follow-up call:  Left voice message

## 2022-06-06 ENCOUNTER — Ambulatory Visit
Admission: EM | Admit: 2022-06-06 | Discharge: 2022-06-06 | Disposition: A | Discharge status: Home / Self Care | Payer: Self-pay

## 2022-06-06 DIAGNOSIS — J069 Acute upper respiratory infection, unspecified: Secondary | ICD-10-CM

## 2022-06-06 MED ORDER — PSEUDOEPH-BROMPHEN-DM 30-2-10 MG/5ML PO SYRP: 5.0000 mL | ORAL_SOLUTION | Freq: Four times a day (QID) | ORAL | 0 refills | Status: DC | PRN

## 2022-06-06 MED ORDER — FLUTICASONE PROPIONATE 50 MCG/ACT NA SUSP: 2.0000 | Freq: Every day | NASAL | 0 refills | Status: DC

## 2022-06-06 NOTE — ED Provider Notes (Signed)
[facility]    CSN: [id number] Arrival date & time: 06/06/22  1249      History   Chief Complaint Chief Complaint  Patient presents with   Nasal Congestion    HPI Kristen Kline is a 36 y.o. female.   The history is provided by the patient.   Presents with a 4-day history of nasal congestion, chest congestion, chills, and cough.  Patient denies fever, sore throat, headache, wheezing, shortness of breath, difficulty breathing, or GI symptoms.  She reports cough has been productive.  Patient reports she has been taking Robitussin, ibuprofen, and Tylenol with some relief.  She also reports that she took a home COVID test which was negative.  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 Authorizing Provider  acetaminophen (TYLENOL) 500 MG tablet Take 500 mg by mouth every 6 (six) hours as needed.   Yes [provider]  brompheniramine-pseudoephedrine-DM 30-2-10 MG/5ML  syrup Take 5 mLs by mouth 4 (four) times daily as needed. 06/06/22  Yes Kourtni Stineman-Warren, Alda Lea, NP  DM-Doxylamine-Acetaminophen (ROBITUSSIN COLD+FLU NIGHTTIME PO) Take by mouth.   Yes [provider]  fluticasone (FLONASE) 50 MCG/ACT nasal spray Place 2 sprays into both nostrils daily. 06/06/22  Yes Keddrick Wyne-Warren, Alda Lea, NP  ibuprofen (ADVIL) 200 MG tablet Take 200 mg by mouth every 6 (six) hours as needed.   Yes [provider]  albuterol (VENTOLIN HFA) 108 (90 Base) MCG/ACT inhaler Inhale 2 puffs into the lungs every 6 (six) hours as needed for wheezing or shortness of breath. 04/26/22   Cheridan Kibler-Warren, Alda Lea, NP  ascorbic acid (VITAMIN C) 500 MG tablet Take 1 tablet (500 mg total) by mouth daily. 10/14/19   Shelly Coss, MD  cetirizine (ZYRTEC) 10 MG tablet Take 1 tablet (10 mg total) by mouth daily. Patient not taking: Reported on 11/16/2021 01/23/21   Scot Jun, FNP  cholestyramine light (PREVALITE) 4 g packet Take 1 packet (4 g total) by mouth daily. Take daily with a meal Patient not taking: Reported on 11/16/2021 12/01/20   Eulogio Bear, NP  ferrous sulfate 325 (65 FE) MG tablet Take  1 tablet (325 mg total) by mouth daily with breakfast. 10/14/19   Burnadette Pop, MD  metroNIDAZOLE (FLAGYL) 500 MG tablet Take 1 tablet (500 mg total) by mouth 2 (two) times daily. 11/20/21   Cresenzo-Dishmon, Scarlette Calico, CNM  Multiple Vitamin (MULTIVITAMIN) tablet Take 1 tablet by mouth daily.    [provider]  norethindrone (ORTHO MICRONOR) 0.35 MG tablet Take 1 tablet (0.35 mg total) by mouth daily. 11/16/21   Cresenzo-Dishmon, Scarlette Calico, CNM  Prenatal Vit-Fe Fumarate-FA (PNV PRENATAL PLUS MULTIVITAMIN) 27-1 MG TABS Take 1 tablet by mouth daily. 11/16/21   Cresenzo-Dishmon, Scarlette Calico, CNM  promethazine-dextromethorphan (PROMETHAZINE-DM) 6.25-15 MG/5ML syrup Take 5 mLs by mouth 4 (four) times daily as needed for cough. 04/26/22   Tamantha Saline-Warren, Sadie Haber, NP    Family  History Family History  Problem Relation Age of Onset   Heart failure Mother    Diabetes Mother    Hypertension Father    Stroke Paternal Grandfather    Stroke Paternal Grandmother     Social History Social History   Tobacco Use   Smoking status: Former   Smokeless tobacco: Never  Building services engineer Use: Never used  Substance Use Topics   Alcohol use: No   Drug use: No     Allergies   Patient has no known allergies.   Review of Systems Review of Systems Per HPI  Physical Exam Triage Vital Signs ED Triage Vitals  Enc Vitals Group     BP 06/06/22 1354 122/78     Pulse Rate 06/06/22 1354 76     Resp 06/06/22 1354 18     Temp 06/06/22 1354 98.7 F (37.1 C)     Temp Source 06/06/22 1354 Oral     SpO2 06/06/22 1354 96 %     Weight --      Height --      Head Circumference --      Peak Flow --      Pain Score 06/06/22 1351 0     Pain Loc --      Pain Edu? --      Excl. in GC? --    No data found.  Updated Vital Signs BP 122/78 (BP Location: Right Arm)   Pulse 76   Temp 98.7 F (37.1 C) (Oral)   Resp 18   LMP  (Within Weeks) Comment: 2 weeks  SpO2 96%   Visual Acuity Right Eye Distance:   Left Eye Distance:   Bilateral Distance:    Right Eye Near:   Left Eye Near:    Bilateral Near:     Physical Exam Vitals and nursing note reviewed.  Constitutional:      General: She is not in acute distress.    Appearance: Normal appearance.  HENT:     Head: Normocephalic.     Right Ear: Tympanic membrane, ear canal and external ear normal.     Left Ear: Tympanic membrane, ear canal and external ear normal.     Nose: Congestion present. No rhinorrhea.     Right Turbinates: Enlarged and swollen.     Left Turbinates: Enlarged and swollen.     Right Sinus: No maxillary sinus tenderness or frontal sinus tenderness.     Left Sinus: No maxillary sinus tenderness or frontal sinus tenderness.     Mouth/Throat:     Lips: Pink.     Mouth: Mucous membranes are  moist.     Pharynx: Uvula midline. Posterior oropharyngeal erythema present. No pharyngeal swelling, oropharyngeal  exudate or uvula swelling.  Eyes:     Extraocular Movements: Extraocular movements intact.     Conjunctiva/sclera: Conjunctivae normal.     Pupils: Pupils are equal, round, and reactive to light.  Cardiovascular:     Rate and Rhythm: Normal rate and regular rhythm.     Pulses: Normal pulses.     Heart sounds: Normal heart sounds.  Pulmonary:     Effort: Pulmonary effort is normal. No respiratory distress.     Breath sounds: Normal breath sounds. No stridor. No wheezing, rhonchi or rales.  Abdominal:     General: Bowel sounds are normal.     Palpations: Abdomen is soft.     Tenderness: There is no abdominal tenderness.  Musculoskeletal:     Cervical back: Normal range of motion.  Lymphadenopathy:     Cervical: No cervical adenopathy.  Skin:    General: Skin is warm and dry.  Neurological:     General: No focal deficit present.     Mental Status: She is alert and oriented to person, place, and time.  Psychiatric:        Mood and Affect: Mood normal.        Behavior: Behavior normal.      UC Treatments / Results  Labs (all labs ordered are listed, but only abnormal results are displayed) Labs Reviewed - No data to display  EKG   Radiology No results found.  Procedures Procedures (including critical care time)  Medications Ordered in UC Medications - No data to display  Initial Impression / Assessment and Plan / UC Course  I have reviewed the triage vital signs and the nursing notes.  Pertinent labs & imaging results that were available during my care of the patient were reviewed by me and considered in my medical decision making (see chart for details).  The patient is well-appearing, she is in no acute distress, vital signs are stable.  Symptoms are consistent with acute viral upper respiratory infection with cough.  Patient was prescribed  Bromfed-DM for her cough and fluticasone 50 mcg nasal spray for her nasal congestion.  Supportive care recommendations were provided to the patient, along with indications of when to follow-up.  Patient verbalizes understanding.  All questions were answered.  Patient stable for discharge.   Final Clinical Impressions(s) / UC Diagnoses   Final diagnoses:  Viral upper respiratory tract infection with cough     Discharge Instructions      Take medication as prescribed. Increase fluids and allow for plenty of rest. Warm salt water gargles 3-4 times daily if throat pain develops. Recommend using a humidifier at bedtime during sleep and sleeping elevated on pillows while cough symptoms persist. Please be advised that a viral illness can last anywhere from 10 to 14 days, if you develop new symptoms, or if symptoms extend beyond that timeframe, please follow-up with your primary care physician for further evaluation. Follow-up as needed.     ED Prescriptions     Medication Sig Dispense Auth. Provider   brompheniramine-pseudoephedrine-DM 30-2-10 MG/5ML syrup Take 5 mLs by mouth 4 (four) times daily as needed. 140 mL Iyanni Hepp-Warren, Alda Lea, NP   fluticasone (FLONASE) 50 MCG/ACT nasal spray Place 2 sprays into both nostrils daily. 16 g Hymen Arnett-Warren, Alda Lea, NP      PDMP not reviewed this encounter.   Tish Men, NP 06/06/22 1407

## 2022-06-06 NOTE — ED Triage Notes (Signed)
Pt reports nasal congestion, chest congestion, chills, and cough x 4 days. Robitussin, ibuprofen and Tylenol gives some relief.

## 2022-06-06 NOTE — Discharge Instructions (Addendum)
Take medication as prescribed. Increase fluids and allow for plenty of rest. Warm salt water gargles 3-4 times daily if throat pain develops. Recommend using a humidifier at bedtime during sleep and sleeping elevated on pillows while cough symptoms persist. Please be advised that a viral illness can last anywhere from 10 to 14 days, if you develop new symptoms, or if symptoms extend beyond that timeframe, please follow-up with your primary care physician for further evaluation. Follow-up as needed.

## 2022-06-11 ENCOUNTER — Ambulatory Visit: Payer: Self-pay

## 2022-06-11 ENCOUNTER — Inpatient Hospital Stay (HOSPITAL_BASED_OUTPATIENT_CLINIC_OR_DEPARTMENT_OTHER)
Admission: EM | Admit: 2022-06-11 | Discharge: 2022-06-13 | DRG: 872 | Disposition: A | Discharge status: Home / Self Care | Payer: Medicaid Other | Attending: Internal Medicine | Admitting: Internal Medicine

## 2022-06-11 ENCOUNTER — Other Ambulatory Visit: Payer: Self-pay

## 2022-06-11 ENCOUNTER — Emergency Department (HOSPITAL_BASED_OUTPATIENT_CLINIC_OR_DEPARTMENT_OTHER): Payer: Medicaid Other

## 2022-06-11 ENCOUNTER — Encounter (HOSPITAL_COMMUNITY): Payer: Self-pay

## 2022-06-11 ENCOUNTER — Encounter (HOSPITAL_BASED_OUTPATIENT_CLINIC_OR_DEPARTMENT_OTHER): Payer: Self-pay | Admitting: Pediatrics

## 2022-06-11 DIAGNOSIS — Z8249 Family history of ischemic heart disease and other diseases of the circulatory system: Secondary | ICD-10-CM

## 2022-06-11 DIAGNOSIS — Z79899 Other long term (current) drug therapy: Secondary | ICD-10-CM | POA: Diagnosis not present

## 2022-06-11 DIAGNOSIS — D649 Anemia, unspecified: Secondary | ICD-10-CM | POA: Diagnosis present

## 2022-06-11 DIAGNOSIS — A419 Sepsis, unspecified organism: Secondary | ICD-10-CM

## 2022-06-11 DIAGNOSIS — Z823 Family history of stroke: Secondary | ICD-10-CM

## 2022-06-11 DIAGNOSIS — Z87891 Personal history of nicotine dependence: Secondary | ICD-10-CM

## 2022-06-11 DIAGNOSIS — F419 Anxiety disorder, unspecified: Secondary | ICD-10-CM | POA: Diagnosis present

## 2022-06-11 DIAGNOSIS — N12 Tubulo-interstitial nephritis, not specified as acute or chronic: Principal | ICD-10-CM | POA: Diagnosis present

## 2022-06-11 DIAGNOSIS — Z1152 Encounter for screening for COVID-19: Secondary | ICD-10-CM | POA: Diagnosis not present

## 2022-06-11 DIAGNOSIS — Z6841 Body Mass Index (BMI) 40.0 and over, adult: Secondary | ICD-10-CM | POA: Diagnosis not present

## 2022-06-11 DIAGNOSIS — Z833 Family history of diabetes mellitus: Secondary | ICD-10-CM | POA: Diagnosis not present

## 2022-06-11 DIAGNOSIS — N390 Urinary tract infection, site not specified: Secondary | ICD-10-CM | POA: Diagnosis present

## 2022-06-11 DIAGNOSIS — Z8616 Personal history of COVID-19: Secondary | ICD-10-CM

## 2022-06-11 DIAGNOSIS — Z87448 Personal history of other diseases of urinary system: Secondary | ICD-10-CM

## 2022-06-11 DIAGNOSIS — B9620 Unspecified Escherichia coli [E. coli] as the cause of diseases classified elsewhere: Secondary | ICD-10-CM | POA: Diagnosis present

## 2022-06-11 HISTORY — DX: Sepsis, unspecified organism: A41.9

## 2022-06-11 LAB — CBC WITH DIFFERENTIAL/PLATELET
Abs Immature Granulocytes: 0.06 10*3/uL (ref 0.00–0.07)
Basophils Absolute: 0.1 10*3/uL (ref 0.0–0.1)
Basophils Relative: 0 %
Eosinophils Absolute: 0.2 10*3/uL (ref 0.0–0.5)
Eosinophils Relative: 1 %
HCT: 33.4 % — ABNORMAL LOW (ref 36.0–46.0)
Hemoglobin: 11.1 g/dL — ABNORMAL LOW (ref 12.0–15.0)
Immature Granulocytes: 0 %
Lymphocytes Relative: 10 %
Lymphs Abs: 1.6 10*3/uL (ref 0.7–4.0)
MCH: 28.5 pg (ref 26.0–34.0)
MCHC: 33.2 g/dL (ref 30.0–36.0)
MCV: 85.6 fL (ref 80.0–100.0)
Monocytes Absolute: 1.2 10*3/uL — ABNORMAL HIGH (ref 0.1–1.0)
Monocytes Relative: 8 %
Neutro Abs: 13.1 10*3/uL — ABNORMAL HIGH (ref 1.7–7.7)
Neutrophils Relative %: 81 %
Platelets: 415 10*3/uL — ABNORMAL HIGH (ref 150–400)
RBC: 3.9 MIL/uL (ref 3.87–5.11)
RDW: 14.4 % (ref 11.5–15.5)
WBC: 16.2 10*3/uL — ABNORMAL HIGH (ref 4.0–10.5)
nRBC: 0 % (ref 0.0–0.2)

## 2022-06-11 LAB — COMPREHENSIVE METABOLIC PANEL
ALT: 16 U/L (ref 0–44)
AST: 20 U/L (ref 15–41)
Albumin: 3.5 g/dL (ref 3.5–5.0)
Alkaline Phosphatase: 65 U/L (ref 38–126)
Anion gap: 11 (ref 5–15)
BUN: 11 mg/dL (ref 6–20)
CO2: 22 mmol/L (ref 22–32)
Calcium: 9 mg/dL (ref 8.9–10.3)
Chloride: 104 mmol/L (ref 98–111)
Creatinine, Ser: 0.91 mg/dL (ref 0.44–1.00)
GFR, Estimated: >60 mL/min (ref >60)
Glucose, Bld: 125 mg/dL — ABNORMAL HIGH (ref 70–99)
Potassium: 3.8 mmol/L (ref 3.5–5.1)
Sodium: 137 mmol/L (ref 135–145)
Total Bilirubin: 0.8 mg/dL (ref 0.3–1.2)
Total Protein: 7.8 g/dL (ref 6.5–8.1)

## 2022-06-11 LAB — URINALYSIS, MICROSCOPIC (REFLEX): WBC, UA: >50 WBC/hpf (ref 0–5)

## 2022-06-11 LAB — URINALYSIS, ROUTINE W REFLEX MICROSCOPIC
Bilirubin Urine: NEGATIVE
Glucose, UA: NEGATIVE mg/dL
Ketones, ur: NEGATIVE mg/dL
Nitrite: NEGATIVE
Protein, ur: 30 mg/dL — AB
Specific Gravity, Urine: 1.02 (ref 1.005–1.030)
pH: 5.5 (ref 5.0–8.0)

## 2022-06-11 LAB — LIPASE, BLOOD: Lipase: 27 U/L (ref 11–51)

## 2022-06-11 LAB — PROTIME-INR
INR: 1.2 (ref 0.8–1.2)
Prothrombin Time: 15.5 seconds — ABNORMAL HIGH (ref 11.4–15.2)

## 2022-06-11 LAB — RESP PANEL BY RT-PCR (RSV, FLU A&B, COVID)  RVPGX2
Influenza A by PCR: NEGATIVE
Influenza B by PCR: NEGATIVE
Resp Syncytial Virus by PCR: NEGATIVE
SARS Coronavirus 2 by RT PCR: NEGATIVE

## 2022-06-11 LAB — PREGNANCY, URINE: Preg Test, Ur: NEGATIVE

## 2022-06-11 LAB — LACTIC ACID, PLASMA: Lactic Acid, Venous: 1.2 mmol/L (ref 0.5–1.9)

## 2022-06-11 LAB — APTT: aPTT: 34 seconds (ref 24–36)

## 2022-06-11 MED ORDER — ENOXAPARIN SODIUM 80 MG/0.8ML IJ SOSY
65.0000 mg | PREFILLED_SYRINGE | INTRAMUSCULAR | Status: DC
  Administered 2022-06-11: 65 mg via SUBCUTANEOUS
  Filled 2022-06-11 (×2): qty 0.8

## 2022-06-11 MED ORDER — ACETAMINOPHEN 500 MG PO TABS
1000.0000 mg | ORAL_TABLET | Freq: Once | ORAL | Status: AC
  Administered 2022-06-11: 1000 mg via ORAL
  Filled 2022-06-11: qty 2

## 2022-06-11 MED ORDER — HYDROMORPHONE HCL 1 MG/ML IJ SOLN: 0.5000 mg | INTRAMUSCULAR | Status: DC | PRN

## 2022-06-11 MED ORDER — ONDANSETRON HCL 4 MG PO TABS: 4.0000 mg | ORAL_TABLET | Freq: Four times a day (QID) | ORAL | Status: DC | PRN

## 2022-06-11 MED ORDER — LACTATED RINGERS IV BOLUS (SEPSIS)
1000.0000 mL | Freq: Once | INTRAVENOUS | Status: AC
  Administered 2022-06-11: 1000 mL via INTRAVENOUS

## 2022-06-11 MED ORDER — ACETAMINOPHEN 650 MG RE SUPP: 650.0000 mg | Freq: Four times a day (QID) | RECTAL | Status: DC | PRN

## 2022-06-11 MED ORDER — POLYETHYLENE GLYCOL 3350 17 G PO PACK: 17.0000 g | PACK | Freq: Every day | ORAL | Status: DC | PRN

## 2022-06-11 MED ORDER — ONDANSETRON HCL 4 MG/2ML IJ SOLN: 4.0000 mg | Freq: Once | INTRAMUSCULAR | Status: DC

## 2022-06-11 MED ORDER — ACETAMINOPHEN 325 MG PO TABS
650.0000 mg | ORAL_TABLET | Freq: Four times a day (QID) | ORAL | Status: DC | PRN
  Administered 2022-06-12 (×2): 650 mg via ORAL
  Filled 2022-06-11 (×2): qty 2

## 2022-06-11 MED ORDER — LACTATED RINGERS IV SOLN: INTRAVENOUS | Status: DC

## 2022-06-11 MED ORDER — SODIUM CHLORIDE 0.9 % IV SOLN
2.0000 g | INTRAVENOUS | Status: DC
  Administered 2022-06-11 – 2022-06-12 (×2): 2 g via INTRAVENOUS
  Filled 2022-06-11 (×2): qty 20

## 2022-06-11 MED ORDER — OXYCODONE HCL 5 MG PO TABS: 5.0000 mg | ORAL_TABLET | ORAL | Status: DC | PRN

## 2022-06-11 MED ORDER — KETOROLAC TROMETHAMINE 15 MG/ML IJ SOLN
15.0000 mg | Freq: Once | INTRAMUSCULAR | Status: AC
  Administered 2022-06-11: 15 mg via INTRAVENOUS
  Filled 2022-06-11: qty 1

## 2022-06-11 MED ORDER — IBUPROFEN 400 MG PO TABS
600.0000 mg | ORAL_TABLET | Freq: Once | ORAL | Status: AC
  Administered 2022-06-11: 600 mg via ORAL
  Filled 2022-06-11: qty 1

## 2022-06-11 MED ORDER — LACTATED RINGERS IV SOLN: INTRAVENOUS | Status: AC

## 2022-06-11 MED ORDER — LACTATED RINGERS IV BOLUS
2000.0000 mL | Freq: Once | INTRAVENOUS | Status: AC
  Administered 2022-06-11: 2000 mL via INTRAVENOUS

## 2022-06-11 MED ORDER — ONDANSETRON HCL 4 MG/2ML IJ SOLN: 4.0000 mg | Freq: Four times a day (QID) | INTRAMUSCULAR | Status: DC | PRN

## 2022-06-11 MED ORDER — SODIUM CHLORIDE 0.9 % IV SOLN: INTRAVENOUS | Status: DC | PRN

## 2022-06-11 NOTE — ED Notes (Signed)
Patient sitting in recliner 

## 2022-06-11 NOTE — ED Notes (Signed)
1 g Tylenol at 1000 today

## 2022-06-11 NOTE — Sepsis Progress Note (Signed)
Sepsis protocol is being followed by [facility]. 

## 2022-06-11 NOTE — ED Notes (Signed)
ED Provider at bedside. 

## 2022-06-11 NOTE — ED Notes (Signed)
Sitting up in recliner, states she feels better.

## 2022-06-11 NOTE — ED Notes (Signed)
[facility] is here for transport.  

## 2022-06-11 NOTE — ED Notes (Signed)
Pt ambulatory to restroom

## 2022-06-11 NOTE — ED Provider Notes (Signed)
[facility] DEPART[facility] Provider Note   CSN: [id number] Arrival date & time: 06/11/22  9518     History  Chief Complaint  Patient presents with   Abdominal Pain   Fever   Back Pain    Kristen Kline is a 37 y.o. female.  37 year old female presents today for evaluation of lower abdominal pain for the past 3-day duration.  She states she woke up this morning with fever, chills.  She took 2 Tylenol's and wanted to come in for evaluation.  She endorses dysuria, lower abdominal pain, low back pain.  She denies any vaginal discharge, recent change in sexual partners.  She is accompanied by her husband.  Denies history of kidney stones.  The history is provided by the patient. No language interpreter was used.       Home Medications Prior to Admission medications   Medication Sig Start Date End Date Taking? Authorizing Provider  acetaminophen (TYLENOL) 500 MG tablet Take 500 mg by mouth every 6 (six) hours as needed.    [provider]  albuterol (VENTOLIN HFA) 108 (90 Base) MCG/ACT inhaler Inhale 2 puffs into the lungs every 6 (six) hours as needed for wheezing or shortness of breath. 04/26/22   Leath-Warren, Alda Lea, NP  ascorbic acid (VITAMIN C) 500 MG tablet Take 1 tablet (500 mg total) by mouth daily. 10/14/19   Shelly Coss, MD  brompheniramine-pseudoephedrine-DM 30-2-10 MG/5ML syrup Take 5 mLs by mouth 4 (four) times daily as needed. 06/06/22   Leath-Warren, Alda Lea, NP  cetirizine (ZYRTEC) 10 MG tablet Take 1 tablet (10 mg total) by mouth daily. Patient not taking: Reported on 11/16/2021 01/23/21   Scot Jun, FNP  cholestyramine light (PREVALITE) 4 g packet Take 1 packet (4 g total) by mouth daily. Take daily with a meal Patient not taking: Reported on 11/16/2021 12/01/20   Eulogio Bear, NP  DM-Doxylamine-Acetaminophen (ROBITUSSIN COLD+FLU NIGHTTIME PO) Take by mouth.    [provider]  ferrous sulfate 325 (65 FE) MG  tablet Take 1 tablet (325 mg total) by mouth daily with breakfast. 10/14/19   Shelly Coss, MD  fluticasone (FLONASE) 50 MCG/ACT nasal spray Place 2 sprays into both nostrils daily. 06/06/22   Leath-Warren, Alda Lea, NP  ibuprofen (ADVIL) 200 MG tablet Take 200 mg by mouth every 6 (six) hours as needed.    [provider]  metroNIDAZOLE (FLAGYL) 500 MG tablet Take 1 tablet (500 mg total) by mouth 2 (two) times daily. 11/20/21   Cresenzo-Dishmon, Joaquim Lai, CNM  Multiple Vitamin (MULTIVITAMIN) tablet Take 1 tablet by mouth daily.    [provider]  norethindrone (ORTHO MICRONOR) 0.35 MG tablet Take 1 tablet (0.35 mg total) by mouth daily. 11/16/21   Cresenzo-Dishmon, Joaquim Lai, CNM  Prenatal Vit-Fe Fumarate-FA (PNV PRENATAL PLUS MULTIVITAMIN) 27-1 MG TABS Take 1 tablet by mouth daily. 11/16/21   Cresenzo-Dishmon, Joaquim Lai, CNM  promethazine-dextromethorphan (PROMETHAZINE-DM) 6.25-15 MG/5ML syrup Take 5 mLs by mouth 4 (four) times daily as needed for cough. 04/26/22   Leath-Warren, Alda Lea, NP      Allergies    Patient has no known allergies.    Review of Systems   Review of Systems  Constitutional:  Positive for chills and fever.  Gastrointestinal:  Positive for abdominal pain. Negative for nausea and vomiting.  Genitourinary:  Positive for dysuria and flank pain. Negative for difficulty urinating, vaginal bleeding and vaginal discharge.  All other systems reviewed and are negative.   Physical Exam Updated Vital  Signs BP 133/61 (BP Location: Right Arm)   Pulse (!) 114   Temp 99.9 F (37.7 C) (Oral)   Resp (!) 22   Ht 5\' 6"  (1.676 m)   Wt 131.5 kg   LMP 05/17/2022 (Approximate)   SpO2 99%   BMI 46.81 kg/m  Physical Exam Vitals and nursing note reviewed.  Constitutional:      General: She is not in acute distress.    Appearance: Normal appearance. She is not ill-appearing.  HENT:     Head: Normocephalic and atraumatic.     Nose: Nose normal.  Eyes:     General:  No scleral icterus.    Extraocular Movements: Extraocular movements intact.     Conjunctiva/sclera: Conjunctivae normal.  Cardiovascular:     Rate and Rhythm: Normal rate and regular rhythm.     Pulses: Normal pulses.  Pulmonary:     Effort: Pulmonary effort is normal. No respiratory distress.     Breath sounds: Normal breath sounds. No wheezing or rales.  Abdominal:     General: There is no distension.     Tenderness: There is abdominal tenderness. There is no right CVA tenderness, left CVA tenderness, guarding or rebound.  Musculoskeletal:        General: Normal range of motion.     Cervical back: Normal range of motion.  Skin:    General: Skin is warm and dry.  Neurological:     General: No focal deficit present.     Mental Status: She is alert. Mental status is at baseline.     ED Results / Procedures / Treatments   Labs (all labs ordered are listed, but only abnormal results are displayed) Labs Reviewed  URINALYSIS, ROUTINE W REFLEX MICROSCOPIC - Abnormal; Notable for the following components:      Result Value   APPearance HAZY (*)    Hgb urine dipstick MODERATE (*)    Protein, ur 30 (*)    Leukocytes,Ua LARGE (*)    All other components within normal limits  URINALYSIS, MICROSCOPIC (REFLEX) - Abnormal; Notable for the following components:   Bacteria, UA MANY (*)    All other components within normal limits  RESP PANEL BY RT-PCR (RSV, FLU A&B, COVID)  RVPGX2  PREGNANCY, URINE  LIPASE, BLOOD  COMPREHENSIVE METABOLIC PANEL  CBC WITH DIFFERENTIAL/PLATELET    EKG None  Radiology No results found.  Procedures .Critical Care  Performed by: Evlyn Courier, PA-C Authorized by: Evlyn Courier, PA-C   Critical care provider statement:    Critical care time (minutes):  30   Critical care was necessary to treat or prevent imminent or life-threatening deterioration of the following conditions:  Sepsis   Critical care was time spent personally by me on the following  activities:  Development of treatment plan with patient or surrogate, discussions with consultants, evaluation of patient's response to treatment, examination of patient, ordering and review of laboratory studies, ordering and review of radiographic studies, ordering and performing treatments and interventions, pulse oximetry, re-evaluation of patient's condition and review of old charts   Care discussed with: admitting provider       Medications Ordered in ED Medications  lactated ringers bolus 2,000 mL (has no administration in time range)  ketorolac (TORADOL) 15 MG/ML injection 15 mg (has no administration in time range)  ondansetron (ZOFRAN) injection 4 mg (has no administration in time range)    ED Course/ Medical Decision Making/ A&P  Medical Decision Making Amount and/or Complexity of Data Reviewed Labs: ordered. Radiology: ordered. ECG/medicine tests: ordered.  Risk OTC drugs. Prescription drug management. Decision regarding hospitalization.   Medical Decision Making / ED Course   This patient presents to the ED for concern of abdominal pain, fever, this involves an extensive number of treatment options, and is a complaint that carries with it a high risk of complications and morbidity.  The differential diagnosis includes appendicitis, pyelonephritis, kidney stone, UTI, diverticulitis, PID  MDM: 37 year old female presents today for evaluation of flank pain and dysuria.  Fluids, blood work, CT renal study ordered.  Patient is COVID, flu, RSV negative.  UA positive for UTI.  Most likely pyelonephritis.  Will start patient on antibiotics.  She is SIRS positive.  Sepsis protocol initiated.  No hypotension.  CBC shows leukocytosis of 16,000, mild anemia at 11.1.  CMP with preserved renal function.  Discussion had with the patient regarding discharge with close follow-up versus admission.  Following this discussion patient would like to be admitted.  Will  discuss with hospitalist.  Discussed with hospitalist who will accept patient for admission.  Lab Tests: -I ordered, reviewed, and interpreted labs.   The pertinent results include:   Labs Reviewed  URINALYSIS, ROUTINE W REFLEX MICROSCOPIC - Abnormal; Notable for the following components:      Result Value   APPearance HAZY (*)    Hgb urine dipstick MODERATE (*)    Protein, ur 30 (*)    Leukocytes,Ua LARGE (*)    All other components within normal limits  URINALYSIS, MICROSCOPIC (REFLEX) - Abnormal; Notable for the following components:   Bacteria, UA MANY (*)    All other components within normal limits  RESP PANEL BY RT-PCR (RSV, FLU A&B, COVID)  RVPGX2  PREGNANCY, URINE  LIPASE, BLOOD  COMPREHENSIVE METABOLIC PANEL  CBC WITH DIFFERENTIAL/PLATELET      EKG  EKG Interpretation  Date/Time:    Ventricular Rate:    PR Interval:    QRS Duration:   QT Interval:    QTC Calculation:   R Axis:     Text Interpretation:           Imaging Studies ordered: I ordered imaging studies including CT renal stone study I independently visualized and interpreted imaging. I agree with the radiologist interpretation   Medicines ordered and prescription drug management: Meds ordered this encounter  Medications   lactated ringers bolus 2,000 mL   ketorolac (TORADOL) 15 MG/ML injection 15 mg   ondansetron (ZOFRAN) injection 4 mg    -I have reviewed the patients home medicines and have made adjustments as needed   Cardiac Monitoring: The patient was maintained on a cardiac monitor.  I personally viewed and interpreted the cardiac monitored which showed an underlying rhythm of: Sinus tachycardia   Reevaluation: After the interventions noted above, I reevaluated the patient and found that they have :stayed the same  Co morbidities that complicate the patient evaluation  Past Medical History:  Diagnosis Date   Acute hypoxemic respiratory failure due to COVID-19 ([place]) 10/09/2019    Anxiety    Hx of cholecystectomy    Morbid obesity ([place])    UTI (lower urinary tract infection) 05/30/2015      Dispostion: Patient discussed with hospitalist who will accept patient for admission.  Final Clinical Impression(s) / ED Diagnoses Final diagnoses:  Pyelonephritis  Sepsis, due to unspecified organism, unspecified whether acute organ dysfunction present [facility])    Rx / DC Orders ED Discharge Orders  None         Evlyn Courier, PA-C 06/11/22 1428    Gareth Morgan, MD 06/12/22 1425

## 2022-06-11 NOTE — ED Notes (Signed)
[facility] called for hospitalist consult

## 2022-06-11 NOTE — ED Triage Notes (Signed)
C/O fever, lower abdominal pain and low back pain x 3 days; c/o chills as well; c/o painful urination as well.

## 2022-06-11 NOTE — ED Notes (Signed)
Report called to [facility] receiving nurse at [facility]

## 2022-06-11 NOTE — H&P (Signed)
History and Physical    Kristen Pickleiffany Y Kline NGE:[id number]:[id number] DOB: [birth year] DOA: 06/11/2022  PCP: Deneise LeverAmeduite, Leonna S, FNP (Inactive)   Patient coming from: Home   Chief Complaint: Shaking chills, lower abdominal and low back pain, dysuria   HPI: Kristen Kline is a pleasant 37 y.o. female with medical history significant for abnormal uterine bleeding and BMI 47 who presents to the emergency department with fever, chills, dysuria, and pain in her lower abdomen and low back.  Patient reports 3 to 4 days of dysuria with flank pain and lower abdominal discomfort.  She then developed rigors last night.  [facility] Course: Upon arrival to the ED, patient is found to be febrile to 38.1 C and saturating well on room air with mild tachypnea, tachycardia, and systolic blood pressure of 103 and greater.  EKG demonstrates sinus tachycardia.  CT of the abdomen and pelvis notable for perinephric haziness about the right kidney.  WBC is 16,200.  Lactic acid is 1.2.  Blood and urine cultures were collected in the emergency department and the patient was treated with 4 L of IV fluids, Zofran, acetaminophen, Advil, and 2 g IV Rocephin.  [facility].  Review of Systems:  All other systems reviewed and apart from HPI, are negative.  Past Medical History:  Diagnosis Date   Acute hypoxemic respiratory failure due to COVID-19 (HCC) 10/09/2019   Anxiety    Hx of cholecystectomy    Morbid obesity (HCC)    UTI (lower urinary tract infection) 05/30/2015    Past Surgical History:  Procedure Laterality Date   CESAREAN SECTION     CESAREAN SECTION N/A    Phreesia 11/17/2019   CHOLECYSTECTOMY     WISDOM TOOTH EXTRACTION      Social History:   reports that she has quit smoking. She has never used smokeless tobacco. She reports that she does not drink alcohol and does not use drugs.  No Known Allergies  Family History  Problem Relation Age of Onset   Heart  failure Mother    Diabetes Mother    Hypertension Father    Stroke Paternal Grandfather    Stroke Paternal Grandmother      Prior to Admission medications   Medication Sig Start Date End Date Taking? Authorizing Provider  acetaminophen (TYLENOL) 500 MG tablet Take 500 mg by mouth every 6 (six) hours as needed.    [provider]  albuterol (VENTOLIN HFA) 108 (90 Base) MCG/ACT inhaler Inhale 2 puffs into the lungs every 6 (six) hours as needed for wheezing or shortness of breath. 04/26/22   Leath-Warren, Sadie Haberhristie J, NP  ascorbic acid (VITAMIN C) 500 MG tablet Take 1 tablet (500 mg total) by mouth daily. 10/14/19   Burnadette PopAdhikari, Amrit, MD  brompheniramine-pseudoephedrine-DM 30-2-10 MG/5ML syrup Take 5 mLs by mouth 4 (four) times daily as needed. 06/06/22   Leath-Warren, Sadie Haberhristie J, NP  cetirizine (ZYRTEC) 10 MG tablet Take 1 tablet (10 mg total) by mouth daily. Patient not taking: Reported on 11/16/2021 01/23/21   Bing NeighborsHarris, Kimberly S, FNP  cholestyramine light (PREVALITE) 4 g packet Take 1 packet (4 g total) by mouth daily. Take daily with a meal Patient not taking: Reported on 11/16/2021 12/01/20   Valentino NoseMartinez, Jessica A, NP  DM-Doxylamine-Acetaminophen (ROBITUSSIN COLD+FLU NIGHTTIME PO) Take by mouth.    [provider]  ferrous sulfate 325 (65 FE) MG tablet Take 1 tablet (325 mg total) by mouth daily with breakfast. 10/14/19  Shelly Coss, MD  fluticasone (FLONASE) 50 MCG/ACT nasal spray Place 2 sprays into both nostrils daily. 06/06/22   Leath-Warren, Alda Lea, NP  ibuprofen (ADVIL) 200 MG tablet Take 200 mg by mouth every 6 (six) hours as needed.    [provider]  metroNIDAZOLE (FLAGYL) 500 MG tablet Take 1 tablet (500 mg total) by mouth 2 (two) times daily. 11/20/21   Cresenzo-Dishmon, Joaquim Lai, CNM  Multiple Vitamin (MULTIVITAMIN) tablet Take 1 tablet by mouth daily.    [provider]  norethindrone (ORTHO MICRONOR) 0.35 MG tablet Take 1 tablet (0.35 mg total) by  mouth daily. 11/16/21   Cresenzo-Dishmon, Joaquim Lai, CNM  Prenatal Vit-Fe Fumarate-FA (PNV PRENATAL PLUS MULTIVITAMIN) 27-1 MG TABS Take 1 tablet by mouth daily. 11/16/21   Cresenzo-Dishmon, Joaquim Lai, CNM  promethazine-dextromethorphan (PROMETHAZINE-DM) 6.25-15 MG/5ML syrup Take 5 mLs by mouth 4 (four) times daily as needed for cough. 04/26/22   Tish Men, NP    Physical Exam: Vitals:   06/11/22 2030 06/11/22 2100 06/11/22 2108 06/11/22 2154  BP: 110/68 115/68  (!) 114/56  Pulse: (!) 123 (!) 117  (!) 115  Resp: 17 17  19   Temp:   100.3 F (37.9 C) 99.8 F (37.7 C)  TempSrc:   Oral Oral  SpO2: 97% 98%  98%  Weight:      Height:        Constitutional: NAD, calm  Eyes: PERTLA, lids and conjunctivae normal ENMT: Mucous membranes are moist. Posterior pharynx clear of any exudate or lesions.   Neck: supple, no masses  Respiratory: no wheezing, no crackles. No accessory muscle use.  Cardiovascular: S1 & S2 heard, regular rate and rhythm. No extremity edema.  Abdomen: No distension, no tenderness, soft. Bowel sounds active.  Musculoskeletal: no clubbing / cyanosis. No joint deformity upper and lower extremities.   Skin: no significant rashes, lesions, ulcers. Warm, dry, well-perfused. Neurologic: CN 2-12 grossly intact. Moving all extremities. Alert and oriented.  Psychiatric: Pleasant. Cooperative.    Labs and Imaging on Admission: I have personally reviewed following labs and imaging studies  CBC: Recent Labs  Lab 06/11/22 1036  WBC 16.2*  NEUTROABS 13.1*  HGB 11.1*  HCT 33.4*  MCV 85.6  PLT 130*   Basic Metabolic Panel: Recent Labs  Lab 06/11/22 1036  NA 137  K 3.8  CL 104  CO2 22  GLUCOSE 125*  BUN 11  CREATININE 0.91  CALCIUM 9.0   GFR: Estimated Creatinine Clearance: 121.3 mL/min (by C-G formula based on SCr of 0.91 mg/dL). Liver Function Tests: Recent Labs  Lab 06/11/22 1036  AST 20  ALT 16  ALKPHOS 65  BILITOT 0.8  PROT 7.8  ALBUMIN 3.5    Recent Labs  Lab 06/11/22 1036  LIPASE 27   No results for input(s): "AMMONIA" in the last 168 hours. Coagulation Profile: Recent Labs  Lab 06/11/22 1338  INR 1.2   Cardiac Enzymes: No results for input(s): "CKTOTAL", "CKMB", "CKMBINDEX", "TROPONINI" in the last 168 hours. BNP (last 3 results) No results for input(s): "PROBNP" in the last 8760 hours. HbA1C: No results for input(s): "HGBA1C" in the last 72 hours. CBG: No results for input(s): "GLUCAP" in the last 168 hours. Lipid Profile: No results for input(s): "CHOL", "HDL", "LDLCALC", "TRIG", "CHOLHDL", "LDLDIRECT" in the last 72 hours. Thyroid Function Tests: No results for input(s): "TSH", "T4TOTAL", "FREET4", "T3FREE", "THYROIDAB" in the last 72 hours. Anemia Panel: No results for input(s): "VITAMINB12", "FOLATE", "FERRITIN", "TIBC", "IRON", "RETICCTPCT" in the last 72 hours. Urine  analysis:    Component Value Date/Time   COLORURINE YELLOW 06/11/2022 0850   APPEARANCEUR HAZY (A) 06/11/2022 0850   LABSPEC 1.020 06/11/2022 0850   PHURINE 5.5 06/11/2022 0850   GLUCOSEU NEGATIVE 06/11/2022 0850   HGBUR MODERATE (A) 06/11/2022 0850   BILIRUBINUR NEGATIVE 06/11/2022 0850   KETONESUR NEGATIVE 06/11/2022 0850   PROTEINUR 30 (A) 06/11/2022 0850   UROBILINOGEN 0.2 04/13/2010 0324   NITRITE NEGATIVE 06/11/2022 0850   LEUKOCYTESUR LARGE (A) 06/11/2022 0850   Sepsis Labs: @LABRCNTIP (procalcitonin:4,lacticidven:4) ) Recent Results (from the past 240 hour(s))  Resp panel by RT-PCR (RSV, Flu A&B, Covid) Anterior Nasal Swab     Status: None   Collection Time: 06/11/22  8:45 AM   Specimen: Anterior Nasal Swab  Result Value Ref Range Status   SARS Coronavirus 2 by RT PCR NEGATIVE NEGATIVE Final    Comment: (NOTE) SARS-CoV-2 target nucleic acids are NOT DETECTED.  The SARS-CoV-2 RNA is generally detectable in upper respiratory specimens during the acute phase of infection. The lowest concentration of SARS-CoV-2 viral  copies this assay can detect is 138 copies/mL. A negative result does not preclude SARS-Cov-2 infection and should not be used as the sole basis for treatment or other patient management decisions. A negative result may occur with  improper specimen collection/handling, submission of specimen other than nasopharyngeal swab, presence of viral mutation(s) within the areas targeted by this assay, and inadequate number of viral copies(<138 copies/mL). A negative result must be combined with clinical observations, patient history, and epidemiological information. The expected result is Negative.  Fact Sheet for Patients:  EntrepreneurPulse.com.au  Fact Sheet for Healthcare Providers:  IncredibleEmployment.be  This test is no t yet approved or cleared by the [place] FDA and  has been authorized for detection and/or diagnosis of SARS-CoV-2 by FDA under an Emergency Use Authorization (EUA). This EUA will remain  in effect (meaning this test can be used) for the duration of the COVID-19 declaration under Section 564(b)(1) of the Act, 21 U.S.C.section 360bbb-3(b)(1), unless the authorization is terminated  or revoked sooner.       Influenza A by PCR NEGATIVE NEGATIVE Final   Influenza B by PCR NEGATIVE NEGATIVE Final    Comment: (NOTE) The Xpert Xpress SARS-CoV-2/FLU/RSV plus assay is intended as an aid in the diagnosis of influenza from Nasopharyngeal swab specimens and should not be used as a sole basis for treatment. Nasal washings and aspirates are unacceptable for Xpert Xpress SARS-CoV-2/FLU/RSV testing.  Fact Sheet for Patients: EntrepreneurPulse.com.au  Fact Sheet for Healthcare Providers: IncredibleEmployment.be  This test is not yet approved or cleared by the [place] FDA and has been authorized for detection and/or diagnosis of SARS-CoV-2 by FDA under an Emergency Use Authorization (EUA). This  EUA will remain in effect (meaning this test can be used) for the duration of the COVID-19 declaration under Section 564(b)(1) of the Act, 21 U.S.C. section 360bbb-3(b)(1), unless the authorization is terminated or revoked.     Resp Syncytial Virus by PCR NEGATIVE NEGATIVE Final    Comment: (NOTE) Fact Sheet for Patients: EntrepreneurPulse.com.au  Fact Sheet for Healthcare Providers: IncredibleEmployment.be  This test is not yet approved or cleared by the [place] FDA and has been authorized for detection and/or diagnosis of SARS-CoV-2 by FDA under an Emergency Use Authorization (EUA). This EUA will remain in effect (meaning this test can be used) for the duration of the COVID-19 declaration under Section 564(b)(1) of the Act, 21 U.S.C. section 360bbb-3(b)(1), unless the authorization is terminated  or revoked.  Performed at [facility], [address] [place], [place] [address]      Radiological Exams on Admission: CT Renal Stone Study  Result Date: 06/11/2022 CLINICAL DATA:  37 year old female with history of fever and lower abdominal and flank pain. Chills. Pain during urination. EXAM: CT ABDOMEN AND PELVIS WITHOUT CONTRAST TECHNIQUE: Multidetector CT imaging of the abdomen and pelvis was performed following the standard protocol without IV contrast. RADIATION DOSE REDUCTION: This exam was performed according to the departmental dose-optimization program which includes automated exposure control, adjustment of the mA and/or kV according to patient size and/or use of iterative reconstruction technique. COMPARISON:  No priors. FINDINGS: Lower chest: Unremarkable. Hepatobiliary: Diffuse low attenuation throughout the hepatic parenchyma, indicative of a background of hepatic steatosis. No definite suspicious cystic or solid hepatic lesions are confidently identified on today's noncontrast CT examination. Status post cholecystectomy.  Pancreas: No definite pancreatic mass or peripancreatic fluid collections or inflammatory changes are noted on today's noncontrast CT examination. Spleen: Unremarkable. Adrenals/Urinary Tract: There are no abnormal calcifications within the collecting system of either kidney, along the course of either ureter, or within the lumen of the urinary bladder. No hydroureteronephrosis. Mild haziness of the perinephric fat surrounding the right kidney, nonspecific. Unenhanced appearance of the kidneys is otherwise unremarkable bilaterally. Unenhanced appearance of the urinary bladder is unremarkable. Right adrenal gland is normal in appearance. 1.9 cm low-attenuation (5 HU) left adrenal nodule, compatible with an adenoma. Stomach/Bowel: Unenhanced appearance of the stomach is normal. There is no pathologic dilatation of small bowel or colon. A few scattered colonic diverticuli are noted, without surrounding inflammatory changes to suggest an acute diverticulitis at this time. Normal appendix. Vascular/Lymphatic: No atherosclerotic calcifications are noted in the abdominal aorta or pelvic vasculature. No lymphadenopathy noted in the abdomen or pelvis. Reproductive: Uterus and ovaries are unremarkable in appearance. Other: No significant volume of ascites.  No pneumoperitoneum. Musculoskeletal: There are no aggressive appearing lytic or blastic lesions noted in the visualized portions of the skeleton. IMPRESSION: 1. No urinary tract calculi or findings of urinary tract obstruction. 2. Slight haziness of the perinephric fat around the right kidney. This is nonspecific, but the possibility of pyelonephritis is not excluded. Correlation with urinalysis is recommended. 3. Hepatic steatosis. 4. Mild colonic diverticulosis without evidence of acute diverticulitis at this time. 5. Aortic atherosclerosis. Electronically Signed   By: Vinnie Langton M.D.   On: 06/11/2022 11:23    EKG: Independently reviewed. Sinus tachycardia, rate  103.   Assessment/Plan   1. Sepsis secondary to UTI  - Blood and urine cultures collected in ED, 30 cc/kg LR bolus was given, and she was started on Rocephin  - Continue Rocephin 2 g IV q24h, follow cultures and clinical response to treatment    DVT prophylaxis: Lovenox  Code Status: Full  Level of Care: Level of care: Med-Surg Family Communication: Husband at bedside   Disposition Plan:  Patient is from: home  Anticipated d/c is to: Home  Anticipated d/c date is: 06/13/22 Patient currently: Pending improved sepsis parameters  Consults called: none  Admission status: inpatient     Vianne Bulls, MD [facility]  06/11/2022, 10:29 PM

## 2022-06-11 NOTE — ED Notes (Signed)
[facility] ETA 5 minutes 

## 2022-06-11 NOTE — ED Notes (Signed)
Ambulated to BR, gait steady 

## 2022-06-12 ENCOUNTER — Ambulatory Visit: Payer: Self-pay | Admitting: Family Medicine

## 2022-06-12 LAB — CBC
HCT: 29.5 % — ABNORMAL LOW (ref 36.0–46.0)
Hemoglobin: 9.6 g/dL — ABNORMAL LOW (ref 12.0–15.0)
MCH: 28.7 pg (ref 26.0–34.0)
MCHC: 32.5 g/dL (ref 30.0–36.0)
MCV: 88.1 fL (ref 80.0–100.0)
Platelets: 333 10*3/uL (ref 150–400)
RBC: 3.35 MIL/uL — ABNORMAL LOW (ref 3.87–5.11)
RDW: 14 % (ref 11.5–15.5)
WBC: 10.3 10*3/uL (ref 4.0–10.5)
nRBC: 0 % (ref 0.0–0.2)

## 2022-06-12 LAB — BASIC METABOLIC PANEL
Anion gap: 6 (ref 5–15)
BUN: 8 mg/dL (ref 6–20)
CO2: 24 mmol/L (ref 22–32)
Calcium: 8.3 mg/dL — ABNORMAL LOW (ref 8.9–10.3)
Chloride: 105 mmol/L (ref 98–111)
Creatinine, Ser: 0.88 mg/dL (ref 0.44–1.00)
GFR, Estimated: >60 mL/min (ref >60)
Glucose, Bld: 125 mg/dL — ABNORMAL HIGH (ref 70–99)
Potassium: 3.5 mmol/L (ref 3.5–5.1)
Sodium: 135 mmol/L (ref 135–145)

## 2022-06-12 LAB — HIV ANTIBODY (ROUTINE TESTING W REFLEX): HIV Screen 4th Generation wRfx: NONREACTIVE

## 2022-06-12 LAB — MAGNESIUM: Magnesium: 1.8 mg/dL (ref 1.7–2.4)

## 2022-06-12 MED ORDER — ACETAMINOPHEN 500 MG PO TABS
1000.0000 mg | ORAL_TABLET | Freq: Four times a day (QID) | ORAL | Status: DC | PRN
  Administered 2022-06-12 – 2022-06-13 (×4): 1000 mg via ORAL
  Filled 2022-06-12 (×4): qty 2

## 2022-06-12 MED ORDER — GUAIFENESIN-DM 100-10 MG/5ML PO SYRP
5.0000 mL | ORAL_SOLUTION | ORAL | Status: DC | PRN
  Administered 2022-06-12: 5 mL via ORAL
  Filled 2022-06-12: qty 10

## 2022-06-12 MED ORDER — ACETAMINOPHEN 650 MG RE SUPP: 650.0000 mg | Freq: Four times a day (QID) | RECTAL | Status: DC | PRN

## 2022-06-12 MED ORDER — LACTATED RINGERS IV SOLN: INTRAVENOUS | Status: AC

## 2022-06-12 NOTE — Progress Notes (Signed)
PROGRESS NOTE    Kristen Kline  HGD:[id number] DOB: [birth year] DOA: 06/11/2022 PCP: Claire Shown, FNP (Inactive)    Brief Narrative:  37 year old with morbid obesity presented with 3 to 4 days of dysuria and right flank pain, 1 day of shaking chills and rigors.  In the emergency room temperature 38.1, on room air, tachypneic and tachycardic.  Blood pressure is adequate.  WC count 16,200.  Admitted with IV antibiotics and IV fluids.   Assessment & Plan:   Sepsis present on admission secondary to right-sided pyonephritis: Sepsis present as the patient had  Temperature 102.6 Heart rate 128 WBC count 16,000. And suspect his source of infections is pyelonephritis. Clinically stabilizing. Continue Rocephin until final cultures.  Blood cultures and urine cultures no growth so far.  CT scan abdomen pelvis with suspected right-sided pyelonephritis but no obstructive uropathy.  Morbid obesity: With BMI more than 40.  She will definitely benefit with diet exercise weight loss program as outpatient.  DU B: On iron supplements.  Will resume on discharge.   DVT prophylaxis: SCDs   Code Status: Full code Family Communication: Boyfriend at the bedside Disposition Plan: Status is: Inpatient Remains inpatient appropriate because: Significant infection, IV antibiotics     Consultants:  None  Procedures:  None  Antimicrobials:  Rocephin 1/1----   Subjective: Patient seen and examined.  Still complains of occasional chills but fever is down now.  Denies any nausea vomiting or abdominal pain.  Objective: Vitals:   06/11/22 2356 06/12/22 0235 06/12/22 0520 06/12/22 0921  BP: 116/72 126/68 136/81 125/76  Pulse: (!) 111 (!) 109 (!) 109 (!) 105  Resp: 18 16 18 18   Temp: 98.5 F (36.9 C) 99.6 F (37.6 C) 98.7 F (37.1 C) 99.4 F (37.4 C)  TempSrc: Oral Oral Oral Oral  SpO2: 100% 98% 100% 98%  Weight:      Height:        Intake/Output Summary (Last 24 hours) at  06/12/2022 1047 Last data filed at 06/12/2022 9622 Gross per 24 hour  Intake 5666.18 ml  Output 0 ml  Net 5666.18 ml   Filed Weights   06/11/22 0845  Weight: 131.5 kg    Examination:  General exam: Appears calm and comfortable at rest. Respiratory system: Clear to auscultation. Respiratory effort normal.  No added sounds. Cardiovascular system: S1 & S2 heard, RRR. No JVD, murmurs, rubs, gallops or clicks. No pedal edema. Gastrointestinal system: Abdomen is nondistended, soft and nontender. No organomegaly or masses felt. Normal bowel sounds heard. Central nervous system: Alert and oriented. No focal neurological deficits. Extremities: Symmetric 5 x 5 power. Skin: No rashes, lesions or ulcers Psychiatry: Judgement and insight appear normal. Mood & affect appropriate.     Data Reviewed: I have personally reviewed following labs and imaging studies  CBC: Recent Labs  Lab 06/11/22 1036 06/12/22 0439  WBC 16.2* 10.3  NEUTROABS 13.1*  --   HGB 11.1* 9.6*  HCT 33.4* 29.5*  MCV 85.6 88.1  PLT 415* 297   Basic Metabolic Panel: Recent Labs  Lab 06/11/22 1036 06/12/22 0439  NA 137 135  K 3.8 3.5  CL 104 105  CO2 22 24  GLUCOSE 125* 125*  BUN 11 8  CREATININE 0.91 0.88  CALCIUM 9.0 8.3*  MG  --  1.8   GFR: Estimated Creatinine Clearance: 125.4 mL/min (by C-G formula based on SCr of 0.88 mg/dL). Liver Function Tests: Recent Labs  Lab 06/11/22 1036  AST 20  ALT 16  ALKPHOS 65  BILITOT 0.8  PROT 7.8  ALBUMIN 3.5   Recent Labs  Lab 06/11/22 1036  LIPASE 27   No results for input(s): "AMMONIA" in the last 168 hours. Coagulation Profile: Recent Labs  Lab 06/11/22 1338  INR 1.2   Cardiac Enzymes: No results for input(s): "CKTOTAL", "CKMB", "CKMBINDEX", "TROPONINI" in the last 168 hours. BNP (last 3 results) No results for input(s): "PROBNP" in the last 8760 hours. HbA1C: No results for input(s): "HGBA1C" in the last 72 hours. CBG: No results for  input(s): "GLUCAP" in the last 168 hours. Lipid Profile: No results for input(s): "CHOL", "HDL", "LDLCALC", "TRIG", "CHOLHDL", "LDLDIRECT" in the last 72 hours. Thyroid Function Tests: No results for input(s): "TSH", "T4TOTAL", "FREET4", "T3FREE", "THYROIDAB" in the last 72 hours. Anemia Panel: No results for input(s): "VITAMINB12", "FOLATE", "FERRITIN", "TIBC", "IRON", "RETICCTPCT" in the last 72 hours. Sepsis Labs: Recent Labs  Lab 06/11/22 1320  LATICACIDVEN 1.2    Recent Results (from the past 240 hour(s))  Resp panel by RT-PCR (RSV, Flu A&B, Covid) Anterior Nasal Swab     Status: None   Collection Time: 06/11/22  8:45 AM   Specimen: Anterior Nasal Swab  Result Value Ref Range Status   SARS Coronavirus 2 by RT PCR NEGATIVE NEGATIVE Final    Comment: (NOTE) SARS-CoV-2 target nucleic acids are NOT DETECTED.  The SARS-CoV-2 RNA is generally detectable in upper respiratory specimens during the acute phase of infection. The lowest concentration of SARS-CoV-2 viral copies this assay can detect is 138 copies/mL. A negative result does not preclude SARS-Cov-2 infection and should not be used as the sole basis for treatment or other patient management decisions. A negative result may occur with  improper specimen collection/handling, submission of specimen other than nasopharyngeal swab, presence of viral mutation(s) within the areas targeted by this assay, and inadequate number of viral copies(<138 copies/mL). A negative result must be combined with clinical observations, patient history, and epidemiological information. The expected result is Negative.  Fact Sheet for Patients:  EntrepreneurPulse.com.au  Fact Sheet for Healthcare Providers:  IncredibleEmployment.be  This test is no t yet approved or cleared by the [place] FDA and  has been authorized for detection and/or diagnosis of SARS-CoV-2 by FDA under an Emergency Use  Authorization (EUA). This EUA will remain  in effect (meaning this test can be used) for the duration of the COVID-19 declaration under Section 564(b)(1) of the Act, 21 U.S.C.section 360bbb-3(b)(1), unless the authorization is terminated  or revoked sooner.       Influenza A by PCR NEGATIVE NEGATIVE Final   Influenza B by PCR NEGATIVE NEGATIVE Final    Comment: (NOTE) The Xpert Xpress SARS-CoV-2/FLU/RSV plus assay is intended as an aid in the diagnosis of influenza from Nasopharyngeal swab specimens and should not be used as a sole basis for treatment. Nasal washings and aspirates are unacceptable for Xpert Xpress SARS-CoV-2/FLU/RSV testing.  Fact Sheet for Patients: EntrepreneurPulse.com.au  Fact Sheet for Healthcare Providers: IncredibleEmployment.be  This test is not yet approved or cleared by the [place] FDA and has been authorized for detection and/or diagnosis of SARS-CoV-2 by FDA under an Emergency Use Authorization (EUA). This EUA will remain in effect (meaning this test can be used) for the duration of the COVID-19 declaration under Section 564(b)(1) of the Act, 21 U.S.C. section 360bbb-3(b)(1), unless the authorization is terminated or revoked.     Resp Syncytial Virus by PCR NEGATIVE NEGATIVE Final    Comment: (NOTE) Fact Sheet for Patients:  BloggerCourse.com  Fact Sheet for Healthcare Providers: SeriousBroker.it  This test is not yet approved or cleared by the [place] FDA and has been authorized for detection and/or diagnosis of SARS-CoV-2 by FDA under an Emergency Use Authorization (EUA). This EUA will remain in effect (meaning this test can be used) for the duration of the COVID-19 declaration under Section 564(b)(1) of the Act, 21 U.S.C. section 360bbb-3(b)(1), unless the authorization is terminated or revoked.  Performed at [facility], [address] [place], [place] [address]   Blood Culture (routine x 2)     Status: None (Preliminary result)   Collection Time: 06/11/22  1:20 PM   Specimen: BLOOD  Result Value Ref Range Status   Specimen Description BLOOD RIGHT ANTECUBITAL  Final   Special Requests   Final    BOTTLES DRAWN AEROBIC AND ANAEROBIC Blood Culture adequate volume   Culture   Final    NO GROWTH < 12 HOURS Performed at [facility], [address], [place], [place] [address]    Report Status PENDING  Incomplete  Blood Culture (routine x 2)     Status: None (Preliminary result)   Collection Time: 06/11/22  1:38 PM   Specimen: BLOOD RIGHT FOREARM  Result Value Ref Range Status   Specimen Description   Final    BLOOD RIGHT FOREARM Performed at [facility], [address] [place], [place] [address]    Special Requests   Final    BOTTLES DRAWN AEROBIC AND ANAEROBIC Blood Culture adequate volume Performed at [facility], [address] [place], [place] [address]    Culture   Final    NO GROWTH < 12 HOURS Performed at [facility], [address], [place], [place] [address]    Report Status PENDING  Incomplete         Radiology Studies: CT Renal Stone Study  Result Date: 06/11/2022 CLINICAL DATA:  36 year old female with history of fever and lower abdominal and flank pain. Chills. Pain during urination. EXAM: CT ABDOMEN AND PELVIS WITHOUT CONTRAST TECHNIQUE: Multidetector CT imaging of the abdomen and pelvis was performed following the standard protocol without IV contrast. RADIATION DOSE REDUCTION: This exam was performed according to the departmental dose-optimization program which includes automated exposure control, adjustment of the mA and/or kV according to patient size and/or use of iterative reconstruction technique. COMPARISON:  No priors. FINDINGS: Lower chest: Unremarkable. Hepatobiliary: Diffuse low attenuation throughout the hepatic parenchyma, indicative of a  background of hepatic steatosis. No definite suspicious cystic or solid hepatic lesions are confidently identified on today's noncontrast CT examination. Status post cholecystectomy. Pancreas: No definite pancreatic mass or peripancreatic fluid collections or inflammatory changes are noted on today's noncontrast CT examination. Spleen: Unremarkable. Adrenals/Urinary Tract: There are no abnormal calcifications within the collecting system of either kidney, along the course of either ureter, or within the lumen of the urinary bladder. No hydroureteronephrosis. Mild haziness of the perinephric fat surrounding the right kidney, nonspecific. Unenhanced appearance of the kidneys is otherwise unremarkable bilaterally. Unenhanced appearance of the urinary bladder is unremarkable. Right adrenal gland is normal in appearance. 1.9 cm low-attenuation (5 HU) left adrenal nodule, compatible with an adenoma. Stomach/Bowel: Unenhanced appearance of the stomach is normal. There is no pathologic dilatation of small bowel or colon. A few scattered colonic diverticuli are noted, without surrounding inflammatory changes to suggest an acute diverticulitis at this time. Normal appendix. Vascular/Lymphatic: No atherosclerotic calcifications are noted  in the abdominal aorta or pelvic vasculature. No lymphadenopathy noted in the abdomen or pelvis. Reproductive: Uterus and ovaries are unremarkable in appearance. Other: No significant volume of ascites.  No pneumoperitoneum. Musculoskeletal: There are no aggressive appearing lytic or blastic lesions noted in the visualized portions of the skeleton. IMPRESSION: 1. No urinary tract calculi or findings of urinary tract obstruction. 2. Slight haziness of the perinephric fat around the right kidney. This is nonspecific, but the possibility of pyelonephritis is not excluded. Correlation with urinalysis is recommended. 3. Hepatic steatosis. 4. Mild colonic diverticulosis without evidence of acute  diverticulitis at this time. 5. Aortic atherosclerosis. Electronically Signed   By: Trudie Reed M.D.   On: 06/11/2022 11:23        Scheduled Meds:  enoxaparin (LOVENOX) injection  65 mg Subcutaneous Q24H   Continuous Infusions:  sodium chloride Stopped (06/11/22 1447)   cefTRIAXone (ROCEPHIN)  IV Stopped (06/11/22 1445)   lactated ringers       LOS: 1 day    Time spent: 35 minutes    Dorcas Carrow, MD [facility] Pager [phone]

## 2022-06-12 NOTE — Progress Notes (Signed)
Patient vital sign's [place] turned to yellow. Notified MD Dante Gang. Discussed patient's conditions with CN Mary. No acute change. Will continue monitor.

## 2022-06-12 NOTE — TOC CM/SW Note (Signed)
Transition of Care [facility]) Screening Note  Patient Details  Name: KEYUNA CUTHRELL Date of Birth: 10/26/1985  Transition of Care [facility]) CM/SW Contact:    Sherie Don, LCSW Phone Number: 06/12/2022, 10:13 AM  Transition of Care Department [facility]) has reviewed patient and no TOC needs have been identified at this time. We will continue to monitor patient advancement through interdisciplinary progression rounds. If new patient transition needs arise, please place a TOC consult.

## 2022-06-13 LAB — CBC
HCT: 28 % — ABNORMAL LOW (ref 36.0–46.0)
Hemoglobin: 8.9 g/dL — ABNORMAL LOW (ref 12.0–15.0)
MCH: 28.3 pg (ref 26.0–34.0)
MCHC: 31.8 g/dL (ref 30.0–36.0)
MCV: 89.2 fL (ref 80.0–100.0)
Platelets: 265 10*3/uL (ref 150–400)
RBC: 3.14 MIL/uL — ABNORMAL LOW (ref 3.87–5.11)
RDW: 13.8 % (ref 11.5–15.5)
WBC: 6.1 10*3/uL (ref 4.0–10.5)
nRBC: 0 % (ref 0.0–0.2)

## 2022-06-13 LAB — BASIC METABOLIC PANEL
Anion gap: 8 (ref 5–15)
BUN: 7 mg/dL (ref 6–20)
CO2: 24 mmol/L (ref 22–32)
Calcium: 8.3 mg/dL — ABNORMAL LOW (ref 8.9–10.3)
Chloride: 102 mmol/L (ref 98–111)
Creatinine, Ser: 0.87 mg/dL (ref 0.44–1.00)
GFR, Estimated: >60 mL/min (ref >60)
Glucose, Bld: 113 mg/dL — ABNORMAL HIGH (ref 70–99)
Potassium: 3.4 mmol/L — ABNORMAL LOW (ref 3.5–5.1)
Sodium: 134 mmol/L — ABNORMAL LOW (ref 135–145)

## 2022-06-13 LAB — URINE CULTURE: Culture: >=100000 — AB

## 2022-06-13 MED ORDER — SODIUM CHLORIDE 0.9 % IV SOLN
2.0000 g | Freq: Once | INTRAVENOUS | Status: AC
  Administered 2022-06-13: 2 g via INTRAVENOUS
  Filled 2022-06-13: qty 20

## 2022-06-13 MED ORDER — CEFDINIR 300 MG PO CAPS: 300.0000 mg | ORAL_CAPSULE | Freq: Two times a day (BID) | ORAL | 0 refills | Status: AC

## 2022-06-13 MED ORDER — POTASSIUM CHLORIDE CRYS ER 20 MEQ PO TBCR
40.0000 meq | EXTENDED_RELEASE_TABLET | Freq: Once | ORAL | Status: AC
  Administered 2022-06-13: 40 meq via ORAL
  Filled 2022-06-13: qty 2

## 2022-06-13 NOTE — Progress Notes (Signed)
Discharge instructions discussed with patient and family, verbalized agreement and understanding 

## 2022-06-13 NOTE — Discharge Summary (Signed)
Physician Discharge Summary  Kristen Kline JXB:[id number] DOB: 1986/04/26 DOA: 06/11/2022  PCP: Deneise LeverAmeduite, Leonna S, FNP (Inactive)  Admit date: 06/11/2022 Discharge date: 06/13/2022  Admitted From: Home Disposition: Home  Recommendations for Outpatient Follow-up:  Follow up with PCP in 1-2 weeks  Home Health: N/A Equipment/Devices: N/A  Discharge Condition: Stable CODE STATUS: Full code Diet recommendation: Low-salt and low-carb diet.  Discharge summary: 37 year old with morbid obesity presented with 3 to 4 days of dysuria and right flank pain, 1 day of shaking chills and rigors.  In the emergency room temperature 38.1, on room air, tachypneic and tachycardic.  Blood pressure is adequate.  WBC count 16,200.  Admitted with IV antibiotics and IV fluids and treated for pyelonephritis.  Sepsis present on admission secondary to right-sided pyonephritis: Clinically stabilizing. Blood cultures negative.  Urine culture with pansensitive E. coli.  Rocephin day 3 today. CT scan abdomen pelvis with suspected right-sided pyelonephritis but no obstructive uropathy. Due to source identification and sensitivity available, will treat with oral antibiotics Omnicef 300 mg twice daily for 7 additional days as complicated UTI.   Morbid obesity: With BMI more than 40.  She will definitely benefit with diet exercise weight loss program as outpatient.   DU B: On iron supplements.  Will resume on discharge.  Potassium replaced before discharge.  Stable for discharge.  Discharge Diagnoses:  Principal Problem:   Sepsis due to urinary tract infection [facility](HCC) Active Problems:   Pyelonephritis    Discharge Instructions  Discharge Instructions     Diet - low sodium heart healthy   Complete by: As directed    Increase activity slowly   Complete by: As directed       Allergies as of 06/13/2022   No Known Allergies      Medication List     STOP taking these medications    ascorbic acid  500 MG tablet Commonly known as: VITAMIN C   brompheniramine-pseudoephedrine-DM 30-2-10 MG/5ML syrup   fluticasone 50 MCG/ACT nasal spray Commonly known as: FLONASE   norethindrone 0.35 MG tablet Commonly known as: Ortho Micronor   PNV Prenatal Plus Multivitamin 27-1 MG Tabs   promethazine-dextromethorphan 6.25-15 MG/5ML syrup Commonly known as: PROMETHAZINE-DM       TAKE these medications    acetaminophen 500 MG tablet Commonly known as: TYLENOL Take 1,000 mg by mouth 3 (three) times daily as needed for moderate pain.   albuterol 108 (90 Base) MCG/ACT inhaler Commonly known as: VENTOLIN HFA Inhale 2 puffs into the lungs every 6 (six) hours as needed for wheezing or shortness of breath. What changed: when to take this   cefdinir 300 MG capsule Commonly known as: OMNICEF Take 1 capsule (300 mg total) by mouth 2 (two) times daily for 7 days.   ferrous sulfate 325 (65 FE) MG tablet Take 1 tablet (325 mg total) by mouth daily with breakfast.   ibuprofen 200 MG tablet Commonly known as: ADVIL Take 400-600 mg by mouth 3 (three) times daily as needed for moderate pain.   VITAMIN D PO Take 1 capsule by mouth daily.        No Known Allergies  Consultations: None   Procedures/Studies: CT Renal Stone Study  Result Date: 06/11/2022 CLINICAL DATA:  37 year old female with history of fever and lower abdominal and flank pain. Chills. Pain during urination. EXAM: CT ABDOMEN AND PELVIS WITHOUT CONTRAST TECHNIQUE: Multidetector CT imaging of the abdomen and pelvis was performed following the standard protocol without IV contrast. RADIATION DOSE REDUCTION: This  exam was performed according to the departmental dose-optimization program which includes automated exposure control, adjustment of the mA and/or kV according to patient size and/or use of iterative reconstruction technique. COMPARISON:  No priors. FINDINGS: Lower chest: Unremarkable. Hepatobiliary: Diffuse low attenuation  throughout the hepatic parenchyma, indicative of a background of hepatic steatosis. No definite suspicious cystic or solid hepatic lesions are confidently identified on today's noncontrast CT examination. Status post cholecystectomy. Pancreas: No definite pancreatic mass or peripancreatic fluid collections or inflammatory changes are noted on today's noncontrast CT examination. Spleen: Unremarkable. Adrenals/Urinary Tract: There are no abnormal calcifications within the collecting system of either kidney, along the course of either ureter, or within the lumen of the urinary bladder. No hydroureteronephrosis. Mild haziness of the perinephric fat surrounding the right kidney, nonspecific. Unenhanced appearance of the kidneys is otherwise unremarkable bilaterally. Unenhanced appearance of the urinary bladder is unremarkable. Right adrenal gland is normal in appearance. 1.9 cm low-attenuation (5 HU) left adrenal nodule, compatible with an adenoma. Stomach/Bowel: Unenhanced appearance of the stomach is normal. There is no pathologic dilatation of small bowel or colon. A few scattered colonic diverticuli are noted, without surrounding inflammatory changes to suggest an acute diverticulitis at this time. Normal appendix. Vascular/Lymphatic: No atherosclerotic calcifications are noted in the abdominal aorta or pelvic vasculature. No lymphadenopathy noted in the abdomen or pelvis. Reproductive: Uterus and ovaries are unremarkable in appearance. Other: No significant volume of ascites.  No pneumoperitoneum. Musculoskeletal: There are no aggressive appearing lytic or blastic lesions noted in the visualized portions of the skeleton. IMPRESSION: 1. No urinary tract calculi or findings of urinary tract obstruction. 2. Slight haziness of the perinephric fat around the right kidney. This is nonspecific, but the possibility of pyelonephritis is not excluded. Correlation with urinalysis is recommended. 3. Hepatic steatosis. 4. Mild  colonic diverticulosis without evidence of acute diverticulitis at this time. 5. Aortic atherosclerosis. Electronically Signed   By: Vinnie Langton M.D.   On: 06/11/2022 11:23   (Echo, Carotid, EGD, Colonoscopy, ERCP)    Subjective: Patient seen and examined.  Mother at the bedside.  Sleeping and wakes up with no complaints.  Denies any headache nausea vomiting.  She had low-grade fever earlier today but not having chills and rigors as before.  Eager to go home.  Discussed about taking antibiotics today to complete 3 days of IV antibiotics and subsequently going home on oral antibiotics and patient agrees.  She may also use Tylenol for fever and chills.   Discharge Exam: Vitals:   06/13/22 0043 06/13/22 0338  BP:  116/71  Pulse:  92  Resp:  18  Temp: 99.6 F (37.6 C) 98.3 F (36.8 C)  SpO2:  100%   Vitals:   06/12/22 1940 06/12/22 2329 06/13/22 0043 06/13/22 0338  BP: 128/82 135/81  116/71  Pulse: (!) 103 (!) 103  92  Resp: 19 18  18   Temp: 100.2 F (37.9 C) (!) 103 F (39.4 C) 99.6 F (37.6 C) 98.3 F (36.8 C)  TempSrc: Oral Oral Oral Oral  SpO2: 98% 100%  100%  Weight:      Height:        General: Pt is alert, awake, not in acute distress Cardiovascular: RRR, S1/S2 +, no rubs, no gallops Respiratory: CTA bilaterally, no wheezing, no rhonchi Abdominal: Soft, NT, ND, bowel sounds +, obese and pendulous.  No tenderness. Extremities: no edema, no cyanosis    The results of significant diagnostics from this hospitalization (including imaging, microbiology, ancillary and laboratory)  are listed below for reference.     Microbiology: Recent Results (from the past 240 hour(s))  Resp panel by RT-PCR (RSV, Flu A&B, Covid) Anterior Nasal Swab     Status: None   Collection Time: 06/11/22  8:45 AM   Specimen: Anterior Nasal Swab  Result Value Ref Range Status   SARS Coronavirus 2 by RT PCR NEGATIVE NEGATIVE Final    Comment: (NOTE) SARS-CoV-2 target nucleic acids are NOT  DETECTED.  The SARS-CoV-2 RNA is generally detectable in upper respiratory specimens during the acute phase of infection. The lowest concentration of SARS-CoV-2 viral copies this assay can detect is 138 copies/mL. A negative result does not preclude SARS-Cov-2 infection and should not be used as the sole basis for treatment or other patient management decisions. A negative result may occur with  improper specimen collection/handling, submission of specimen other than nasopharyngeal swab, presence of viral mutation(s) within the areas targeted by this assay, and inadequate number of viral copies(<138 copies/mL). A negative result must be combined with clinical observations, patient history, and epidemiological information. The expected result is Negative.  Fact Sheet for Patients:  EntrepreneurPulse.com.au  Fact Sheet for Healthcare Providers:  IncredibleEmployment.be  This test is no t yet approved or cleared by the [place] FDA and  has been authorized for detection and/or diagnosis of SARS-CoV-2 by FDA under an Emergency Use Authorization (EUA). This EUA will remain  in effect (meaning this test can be used) for the duration of the COVID-19 declaration under Section 564(b)(1) of the Act, 21 U.S.C.section 360bbb-3(b)(1), unless the authorization is terminated  or revoked sooner.       Influenza A by PCR NEGATIVE NEGATIVE Final   Influenza B by PCR NEGATIVE NEGATIVE Final    Comment: (NOTE) The Xpert Xpress SARS-CoV-2/FLU/RSV plus assay is intended as an aid in the diagnosis of influenza from Nasopharyngeal swab specimens and should not be used as a sole basis for treatment. Nasal washings and aspirates are unacceptable for Xpert Xpress SARS-CoV-2/FLU/RSV testing.  Fact Sheet for Patients: EntrepreneurPulse.com.au  Fact Sheet for Healthcare Providers: IncredibleEmployment.be  This test is not yet  approved or cleared by the [place] FDA and has been authorized for detection and/or diagnosis of SARS-CoV-2 by FDA under an Emergency Use Authorization (EUA). This EUA will remain in effect (meaning this test can be used) for the duration of the COVID-19 declaration under Section 564(b)(1) of the Act, 21 U.S.C. section 360bbb-3(b)(1), unless the authorization is terminated or revoked.     Resp Syncytial Virus by PCR NEGATIVE NEGATIVE Final    Comment: (NOTE) Fact Sheet for Patients: EntrepreneurPulse.com.au  Fact Sheet for Healthcare Providers: IncredibleEmployment.be  This test is not yet approved or cleared by the [place] FDA and has been authorized for detection and/or diagnosis of SARS-CoV-2 by FDA under an Emergency Use Authorization (EUA). This EUA will remain in effect (meaning this test can be used) for the duration of the COVID-19 declaration under Section 564(b)(1) of the Act, 21 U.S.C. section 360bbb-3(b)(1), unless the authorization is terminated or revoked.  Performed at [facility], [address] [place], [place] [address]   Urine Culture     Status: Abnormal   Collection Time: 06/11/22 12:51 PM   Specimen: In/Out Cath Urine  Result Value Ref Range Status   Specimen Description   Final    IN/OUT CATH URINE Performed at [facility], [address] [place], [place] [address]    Special Requests  Final    NONE Performed at [facility], [address] [place], [place] [address]    Culture >=100,000 COLONIES/mL ESCHERICHIA COLI (A)  Final   Report Status 06/13/2022 FINAL  Final   Organism ID, Bacteria ESCHERICHIA COLI (A)  Final      Susceptibility   Escherichia coli - MIC*    AMPICILLIN >=32 RESISTANT Resistant     CEFAZOLIN <=4 SENSITIVE Sensitive     CEFEPIME <=0.12 SENSITIVE Sensitive     CEFTRIAXONE <=0.25 SENSITIVE Sensitive     CIPROFLOXACIN <=0.25 SENSITIVE  Sensitive     GENTAMICIN <=1 SENSITIVE Sensitive     IMIPENEM <=0.25 SENSITIVE Sensitive     NITROFURANTOIN <=16 SENSITIVE Sensitive     TRIMETH/SULFA <=20 SENSITIVE Sensitive     AMPICILLIN/SULBACTAM 16 INTERMEDIATE Intermediate     PIP/TAZO <=4 SENSITIVE Sensitive     * >=100,000 COLONIES/mL ESCHERICHIA COLI  Blood Culture (routine x 2)     Status: None (Preliminary result)   Collection Time: 06/11/22  1:20 PM   Specimen: BLOOD  Result Value Ref Range Status   Specimen Description BLOOD RIGHT ANTECUBITAL  Final   Special Requests   Final    BOTTLES DRAWN AEROBIC AND ANAEROBIC Blood Culture adequate volume   Culture   Final    NO GROWTH < 12 HOURS Performed at [facility], [address] [place], [place] [address]    Report Status PENDING  Incomplete  Blood Culture (routine x 2)     Status: None (Preliminary result)   Collection Time: 06/11/22  1:38 PM   Specimen: BLOOD RIGHT FOREARM  Result Value Ref Range Status   Specimen Description   Final    BLOOD RIGHT FOREARM Performed at [facility], [address] [place], [place] [address]    Special Requests   Final    BOTTLES DRAWN AEROBIC AND ANAEROBIC Blood Culture adequate volume Performed at [facility], [address] [place], [place] [address]    Culture   Final    NO GROWTH < 12 HOURS Performed at [facility], [address], [place], [place] [address]    Report Status PENDING  Incomplete     Labs: BNP (last 3 results) No results for input(s): "BNP" in the last 8760 hours. Basic Metabolic Panel: Recent Labs  Lab 06/11/22 1036 06/12/22 0439 06/13/22 0503  NA 137 135 134*  K 3.8 3.5 3.4*  CL 104 105 102  CO2 22 24 24   GLUCOSE 125* 125* 113*  BUN 11 8 7   CREATININE 0.91 0.88 0.87  CALCIUM 9.0 8.3* 8.3*  MG  --  1.8  --    Liver Function Tests: Recent Labs  Lab 06/11/22 1036  AST 20  ALT 16  ALKPHOS 65  BILITOT 0.8  PROT 7.8  ALBUMIN 3.5   Recent Labs  Lab  06/11/22 1036  LIPASE 27   No results for input(s): "AMMONIA" in the last 168 hours. CBC: Recent Labs  Lab 06/11/22 1036 06/12/22 0439 06/13/22 0503  WBC 16.2* 10.3 6.1  NEUTROABS 13.1*  --   --   HGB 11.1* 9.6* 8.9*  HCT 33.4* 29.5* 28.0*  MCV 85.6 88.1 89.2  PLT 415* 333 265   Cardiac Enzymes: No results for input(s): "CKTOTAL", "CKMB", "CKMBINDEX", "TROPONINI" in the last 168 hours. BNP: Invalid input(s): "POCBNP" CBG: No results for input(s): "GLUCAP" in the last 168 hours. D-Dimer No results for input(s): "DDIMER" in  the last 72 hours. Hgb A1c No results for input(s): "HGBA1C" in the last 72 hours. Lipid Profile No results for input(s): "CHOL", "HDL", "LDLCALC", "TRIG", "CHOLHDL", "LDLDIRECT" in the last 72 hours. Thyroid function studies No results for input(s): "TSH", "T4TOTAL", "T3FREE", "THYROIDAB" in the last 72 hours.  Invalid input(s): "FREET3" Anemia work up No results for input(s): "VITAMINB12", "FOLATE", "FERRITIN", "TIBC", "IRON", "RETICCTPCT" in the last 72 hours. Urinalysis    Component Value Date/Time   COLORURINE YELLOW 06/11/2022 0850   APPEARANCEUR HAZY (A) 06/11/2022 0850   LABSPEC 1.020 06/11/2022 0850   PHURINE 5.5 06/11/2022 0850   GLUCOSEU NEGATIVE 06/11/2022 0850   HGBUR MODERATE (A) 06/11/2022 0850   BILIRUBINUR NEGATIVE 06/11/2022 0850   KETONESUR NEGATIVE 06/11/2022 0850   PROTEINUR 30 (A) 06/11/2022 0850   UROBILINOGEN 0.2 04/13/2010 0324   NITRITE NEGATIVE 06/11/2022 0850   LEUKOCYTESUR LARGE (A) 06/11/2022 0850   Sepsis Labs Recent Labs  Lab 06/11/22 1036 06/12/22 0439 06/13/22 0503  WBC 16.2* 10.3 6.1   Microbiology Recent Results (from the past 240 hour(s))  Resp panel by RT-PCR (RSV, Flu A&B, Covid) Anterior Nasal Swab     Status: None   Collection Time: 06/11/22  8:45 AM   Specimen: Anterior Nasal Swab  Result Value Ref Range Status   SARS Coronavirus 2 by RT PCR NEGATIVE NEGATIVE Final    Comment:  (NOTE) SARS-CoV-2 target nucleic acids are NOT DETECTED.  The SARS-CoV-2 RNA is generally detectable in upper respiratory specimens during the acute phase of infection. The lowest concentration of SARS-CoV-2 viral copies this assay can detect is 138 copies/mL. A negative result does not preclude SARS-Cov-2 infection and should not be used as the sole basis for treatment or other patient management decisions. A negative result may occur with  improper specimen collection/handling, submission of specimen other than nasopharyngeal swab, presence of viral mutation(s) within the areas targeted by this assay, and inadequate number of viral copies(<138 copies/mL). A negative result must be combined with clinical observations, patient history, and epidemiological information. The expected result is Negative.  Fact Sheet for Patients:  BloggerCourse.com  Fact Sheet for Healthcare Providers:  SeriousBroker.it  This test is no t yet approved or cleared by the [place] FDA and  has been authorized for detection and/or diagnosis of SARS-CoV-2 by FDA under an Emergency Use Authorization (EUA). This EUA will remain  in effect (meaning this test can be used) for the duration of the COVID-19 declaration under Section 564(b)(1) of the Act, 21 U.S.C.section 360bbb-3(b)(1), unless the authorization is terminated  or revoked sooner.       Influenza A by PCR NEGATIVE NEGATIVE Final   Influenza B by PCR NEGATIVE NEGATIVE Final    Comment: (NOTE) The Xpert Xpress SARS-CoV-2/FLU/RSV plus assay is intended as an aid in the diagnosis of influenza from Nasopharyngeal swab specimens and should not be used as a sole basis for treatment. Nasal washings and aspirates are unacceptable for Xpert Xpress SARS-CoV-2/FLU/RSV testing.  Fact Sheet for Patients: BloggerCourse.com  Fact Sheet for Healthcare  Providers: SeriousBroker.it  This test is not yet approved or cleared by the [place] FDA and has been authorized for detection and/or diagnosis of SARS-CoV-2 by FDA under an Emergency Use Authorization (EUA). This EUA will remain in effect (meaning this test can be used) for the duration of the COVID-19 declaration under Section 564(b)(1) of the Act, 21 U.S.C. section 360bbb-3(b)(1), unless the authorization is terminated or revoked.     Resp Syncytial Virus by  PCR NEGATIVE NEGATIVE Final    Comment: (NOTE) Fact Sheet for Patients: BloggerCourse.com  Fact Sheet for Healthcare Providers: SeriousBroker.it  This test is not yet approved or cleared by the [place] FDA and has been authorized for detection and/or diagnosis of SARS-CoV-2 by FDA under an Emergency Use Authorization (EUA). This EUA will remain in effect (meaning this test can be used) for the duration of the COVID-19 declaration under Section 564(b)(1) of the Act, 21 U.S.C. section 360bbb-3(b)(1), unless the authorization is terminated or revoked.  Performed at [facility], [address] [place], [place] [address]   Urine Culture     Status: Abnormal   Collection Time: 06/11/22 12:51 PM   Specimen: In/Out Cath Urine  Result Value Ref Range Status   Specimen Description   Final    IN/OUT CATH URINE Performed at [facility], [address] [place], [place] [address]    Special Requests   Final    NONE Performed at [facility], [address] [place], [place] [address]    Culture >=100,000 COLONIES/mL ESCHERICHIA COLI (A)  Final   Report Status 06/13/2022 FINAL  Final   Organism ID, Bacteria ESCHERICHIA COLI (A)  Final      Susceptibility   Escherichia coli - MIC*    AMPICILLIN >=32 RESISTANT Resistant     CEFAZOLIN <=4 SENSITIVE Sensitive     CEFEPIME <=0.12 SENSITIVE Sensitive      CEFTRIAXONE <=0.25 SENSITIVE Sensitive     CIPROFLOXACIN <=0.25 SENSITIVE Sensitive     GENTAMICIN <=1 SENSITIVE Sensitive     IMIPENEM <=0.25 SENSITIVE Sensitive     NITROFURANTOIN <=16 SENSITIVE Sensitive     TRIMETH/SULFA <=20 SENSITIVE Sensitive     AMPICILLIN/SULBACTAM 16 INTERMEDIATE Intermediate     PIP/TAZO <=4 SENSITIVE Sensitive     * >=100,000 COLONIES/mL ESCHERICHIA COLI  Blood Culture (routine x 2)     Status: None (Preliminary result)   Collection Time: 06/11/22  1:20 PM   Specimen: BLOOD  Result Value Ref Range Status   Specimen Description BLOOD RIGHT ANTECUBITAL  Final   Special Requests   Final    BOTTLES DRAWN AEROBIC AND ANAEROBIC Blood Culture adequate volume   Culture   Final    NO GROWTH < 12 HOURS Performed at [facility], [address], [place], [place] [address]    Report Status PENDING  Incomplete  Blood Culture (routine x 2)     Status: None (Preliminary result)   Collection Time: 06/11/22  1:38 PM   Specimen: BLOOD RIGHT FOREARM  Result Value Ref Range Status   Specimen Description   Final    BLOOD RIGHT FOREARM Performed at [facility], [address], [place], [place] [address]    Special Requests   Final    BOTTLES DRAWN AEROBIC AND ANAEROBIC Blood Culture adequate volume Performed at [facility], [address] [place], [place] [address]    Culture   Final    NO GROWTH < 12 HOURS Performed at [facility], [address], [place], [place] [address]    Report Status PENDING  Incomplete     Time coordinating discharge: 35 minutes  SIGNED:   Dorcas Carrow, MD  [facility] 06/13/2022, 8:43 AM

## 2022-06-15 ENCOUNTER — Ambulatory Visit: Payer: Self-pay

## 2022-06-15 NOTE — Telephone Encounter (Signed)
Patient called, left VM to return the call to the office to discuss medication with a nurse.  Summary: med side effect   Pt was prescribed cefdinir (OMNICEF) 300 MG capsule / she has taken 3-4 doses and hasn't been able to sleep at all/ she asked if this is a side effect / please advise

## 2022-06-15 NOTE — Telephone Encounter (Signed)
  Chief Complaint: unable to sleep last night- could it be medication SE? Symptoms: unable to sleep last night Frequency: 1 night Pertinent Negatives: Patient denies any other symptoms Disposition: [] ED /[] Urgent Care (no appt availability in office) / [] Appointment(In office/virtual)/ []  [facility]/ [] Home Care/ [] Refused Recommended Disposition /[] [facility]/ [x]  Follow-up with PCP Additional Notes: No PCP per patient- advised UC virtual appointment  for SE and possible change of medication- can make appointment tonight or in am   Reason for Disposition  [1] Caller has NON-URGENT medicine question about med that PCP prescribed AND [2] triager unable to answer question  Answer Assessment - Initial Assessment Questions 1. NAME of MEDICINE: "What medicine(s) are you calling about?"     Omnicef- twice daily- 2. QUESTION: "What is your question?" (e.g., double dose of medicine, side effect)     Is SE insomnia? Patient was unable to sleep last night  3. PRESCRIBER: "Who prescribed the medicine?" Reason: if prescribed by specialist, call should be referred to that group.     ED 4. SYMPTOMS: "Do you have any symptoms?" If Yes, ask: "What symptoms are you having?"  "How bad are the symptoms (e.g., mild, moderate, severe)     Weakness, fever- is feeling better- no other symptoms, unable to fall asleep.   Patient wants to know if insomnia is a SE of antibiotic- patient states she was able to rest some the first night out of the hospital- but did not sleep last night. Patient advised per [employer] [facility]- not a common SE of medication. Patient will do UC virtual visit in am if she does not sleep again tonight. She does report the medication has improved her symptoms.  Protocols used: Medication Question Call-A-AH

## 2022-06-17 LAB — CULTURE, BLOOD (ROUTINE X 2)
Culture: NO GROWTH
Culture: NO GROWTH
Special Requests: ADEQUATE
Special Requests: ADEQUATE

## 2022-06-18 ENCOUNTER — Encounter: Payer: Self-pay | Admitting: Internal Medicine

## 2022-06-18 ENCOUNTER — Ambulatory Visit (INDEPENDENT_AMBULATORY_CARE_PROVIDER_SITE_OTHER): Payer: Medicaid Other | Admitting: Internal Medicine

## 2022-06-18 VITALS — BP 131/84 | HR 81 | Ht 66.0 in | Wt 316.0 lb

## 2022-06-18 DIAGNOSIS — D509 Iron deficiency anemia, unspecified: Secondary | ICD-10-CM | POA: Diagnosis not present

## 2022-06-18 DIAGNOSIS — Z2821 Immunization not carried out because of patient refusal: Secondary | ICD-10-CM

## 2022-06-18 DIAGNOSIS — R7303 Prediabetes: Secondary | ICD-10-CM

## 2022-06-18 DIAGNOSIS — Z0001 Encounter for general adult medical examination with abnormal findings: Secondary | ICD-10-CM

## 2022-06-18 DIAGNOSIS — N12 Tubulo-interstitial nephritis, not specified as acute or chronic: Secondary | ICD-10-CM

## 2022-06-18 DIAGNOSIS — E7841 Elevated Lipoprotein(a): Secondary | ICD-10-CM | POA: Diagnosis not present

## 2022-06-18 DIAGNOSIS — Z1329 Encounter for screening for other suspected endocrine disorder: Secondary | ICD-10-CM

## 2022-06-18 DIAGNOSIS — Z8639 Personal history of other endocrine, nutritional and metabolic disease: Secondary | ICD-10-CM

## 2022-06-18 HISTORY — DX: Encounter for general adult medical examination with abnormal findings: Z00.01

## 2022-06-18 NOTE — Progress Notes (Signed)
New Patient Office Visit  Subjective    Patient ID: Kristen Kline, female    DOB: 27-Dec-1985  Age: 37 y.o. MRN: [id number]  CC:  Chief Complaint  Patient presents with   Establish Care    HPI [name] presents to establish care she is a 37 year old woman with a past medical history significant for iron deficiency, vitamin D deficiency, prediabetes, and hyperlipidemia.  She was recently admitted to [facility] for treatment of pyelonephritis after presenting to [place] ED endorsing a 3-day history of flank pain with fever and chills.  She is currently being treated with [facility] and will complete treatment on 1/10.  Today [name] reports feeling fairly well.  She endorses upper respiratory symptoms of a nonproductive cough and nasal congestion.  She also endorses subjective fever/chills, but has not checked her temperature.  She has been taking her antibiotics as prescribed.  Acute concerns, chronic medical conditions, and outstanding preventative care items discussed today are individually addressed in A/P below.  Outpatient Encounter Medications as of 06/18/2022  Medication Sig   acetaminophen (TYLENOL) 500 MG tablet Take 1,000 mg by mouth 3 (three) times daily as needed for moderate pain.   cefdinir (OMNICEF) 300 MG capsule Take 1 capsule (300 mg total) by mouth 2 (two) times daily for 7 days.   ibuprofen (ADVIL) 200 MG tablet Take 400-600 mg by mouth 3 (three) times daily as needed for moderate pain.   albuterol (VENTOLIN HFA) 108 (90 Base) MCG/ACT inhaler Inhale 2 puffs into the lungs every 6 (six) hours as needed for wheezing or shortness of breath. (Patient not taking: Reported on 06/18/2022)   [DISCONTINUED] ferrous sulfate 325 (65 FE) MG tablet Take 1 tablet (325 mg total) by mouth daily with breakfast.   [DISCONTINUED] VITAMIN D PO Take 1 capsule by mouth daily.   No facility-administered encounter medications on file as of 06/18/2022.     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   Sepsis due to urinary tract infection ([place]) 06/11/2022   UTI (lower urinary tract infection) 05/30/2015    Past Surgical History:  Procedure Laterality Date   CESAREAN SECTION N/A    [employer] 11/17/2019   CHOLECYSTECTOMY     WISDOM TOOTH EXTRACTION      Family History  Problem Relation Age of Onset   Heart failure Mother    Diabetes Mother    Hypertension Father    Stroke Paternal Grandfather    Stroke Paternal Grandmother     Social History   Socioeconomic History   Marital status: Married    Spouse name: Not on file   Number of children: Not on file   Years of education: Not on file   Highest education level: Not on file  Occupational History   Not on file  Tobacco Use   Smoking status: Former   Smokeless tobacco: Never  Vaping Use   Vaping Use: Never used  Substance and Sexual Activity   Alcohol use: No   Drug use: No   Sexual activity: Yes    Birth control/protection: None  Other Topics Concern   Not on file  Social History Narrative   Not on  file   Social Determinants of Health   Financial Resource Strain: Medium Risk (11/16/2021)   Overall Financial Resource Strain (CARDIA)    Difficulty of Paying Living Expenses: Somewhat hard  Food Insecurity: Food Insecurity Present (11/16/2021)   Hunger Vital Sign    Worried About Running Out of Food in the Last Year: Sometimes true    Ran Out of Food in the Last Year: Never true  Transportation Needs: No Transportation Needs (11/16/2021)   PRAPARE - Administrator, Civil Service (Medical): No    Lack of Transportation (Non-Medical): No  Physical Activity: Insufficiently  Active (11/16/2021)   Exercise Vital Sign    Days of Exercise per Week: 4 days    Minutes of Exercise per Session: 20 min  Stress: No Stress Concern Present (11/16/2021)   [employer] of Occupational Health - Occupational Stress Questionnaire    Feeling of Stress : Not at all  Social Connections: Socially Integrated (11/16/2021)   Social Connection and Isolation Panel [NHANES]    Frequency of Communication with Friends and Family: More than three times a week    Frequency of Social Gatherings with Friends and Family: Once a week    Attends Religious Services: More than 4 times per year    Active Member of [employer] or Organizations: No    Attends Banker Meetings: 1 to 4 times per year    Marital Status: Married  Catering manager Violence: Not At Risk (11/16/2021)   Humiliation, Afraid, Rape, and Kick questionnaire    Fear of Current or Ex-Partner: No    Emotionally Abused: No    Physically Abused: No    Sexually Abused: No    Review of Systems  Constitutional:  Positive for chills.  Respiratory:  Positive for cough. Negative for sputum production.     Objective    BP 131/84   Pulse 81   Ht 5\' 6"  (1.676 m)   Wt (!) 316 lb (143.3 kg)   LMP 05/17/2022 (Approximate) Comment: negative u-preg  SpO2 97%   BMI 51.00 kg/m   Physical Exam Vitals reviewed.  Constitutional:      General: She is not in acute distress.    Appearance: Normal appearance. She is obese. She is not toxic-appearing.  HENT:     Head: Normocephalic and atraumatic.     Right Ear: External ear normal.     Left Ear: External ear normal.     Nose: Nose normal. No congestion or rhinorrhea.     Mouth/Throat:     Mouth: Mucous membranes are moist.     Pharynx: Oropharynx is clear. No oropharyngeal exudate or posterior oropharyngeal erythema.  Eyes:     General: No scleral icterus.    Extraocular Movements: Extraocular movements intact.     Conjunctiva/sclera: Conjunctivae normal.     Pupils: Pupils  are equal, round, and reactive to light.  Cardiovascular:     Rate and Rhythm: Normal rate and regular rhythm.     Pulses: Normal pulses.     Heart sounds: Normal heart sounds. No murmur heard.    No friction rub. No gallop.  Pulmonary:     Effort: Pulmonary effort is normal.     Breath sounds: Normal breath sounds. No wheezing, rhonchi or rales.  Abdominal:     General: Abdomen is flat. Bowel sounds are normal. There is no distension.     Palpations: Abdomen is soft.     Tenderness: There is no abdominal tenderness.  Musculoskeletal:  General: No swelling. Normal range of motion.     Cervical back: Normal range of motion.     Right lower leg: No edema.     Left lower leg: No edema.  Lymphadenopathy:     Cervical: No cervical adenopathy.  Skin:    General: Skin is warm and dry.     Capillary Refill: Capillary refill takes less than 2 seconds.     Coloration: Skin is not jaundiced.     Comments: Acanthosis nigricans noted in skin folds around neck  Neurological:     General: No focal deficit present.     Mental Status: She is alert and oriented to person, place, and time.  Psychiatric:        Mood and Affect: Mood normal.        Behavior: Behavior normal.    Last CBC Lab Results  Component Value Date   WBC 6.1 06/13/2022   HGB 8.9 (L) 06/13/2022   HCT 28.0 (L) 06/13/2022   MCV 89.2 06/13/2022   MCH 28.3 06/13/2022   RDW 13.8 06/13/2022   PLT 265 06/13/2022   Last metabolic panel Lab Results  Component Value Date   GLUCOSE 113 (H) 06/13/2022   NA 134 (L) 06/13/2022   K 3.4 (L) 06/13/2022   CL 102 06/13/2022   CO2 24 06/13/2022   BUN 7 06/13/2022   CREATININE 0.87 06/13/2022   GFRNONAA >60 06/13/2022   CALCIUM 8.3 (L) 06/13/2022   PHOS 4.0 10/13/2019   PROT 7.8 06/11/2022   ALBUMIN 3.5 06/11/2022   BILITOT 0.8 06/11/2022   ALKPHOS 65 06/11/2022   AST 20 06/11/2022   ALT 16 06/11/2022   ANIONGAP 8 06/13/2022   Last lipids Lab Results  Component  Value Date   CHOL 201 (H) 11/02/2020   HDL 55 11/02/2020   LDLCALC 128 (H) 11/02/2020   TRIG 81 11/02/2020   CHOLHDL 3.7 11/02/2020   Last hemoglobin A1c Lab Results  Component Value Date   HGBA1C 5.9 (H) 11/02/2020   Last thyroid functions Lab Results  Component Value Date   TSH 1.02 11/19/2019   Last vitamin D Lab Results  Component Value Date   VD25OH 26 (L) 11/02/2020   Assessment & Plan:   Problem List Items Addressed This Visit       Pyelonephritis    Recent hospital admission 1/1 - 1/3 for treatment of pyelonephritis.  She is currently being treated with [facility] and will complete treatment on 1/10.  She denies urinary symptoms or persistent flank pain today.      Microcytic anemia    Previously on iron supplementation but is not currently taking any.  Repeat labs ordered today.      Prediabetes    A1c 5.9 in May 2022.  Repeat A1c ordered today.      Hyperlipidemia    Lipid panel completed in May 2022.  Total cholesterol 201 and LDL 128.  A repeat lipid panel has been ordered today.  I recommended she incorporate elements of the Mediterranean diet to help with improving her cholesterol.      Encounter for general adult medical examination with abnormal findings    Presenting today to establish care.  Previous records and labs been reviewed. -Repeat baseline labs ordered today.  These will be completed in 1 week. -I recommended she receive COVID-19 vaccines at her pharmacy and notify us when this is completed.  Preventative care items are otherwise up-to-date at this time. -We will tentatively plan for follow-up  in 3 months      Return in about 3 months (around 09/17/2022).   Billie Lade, MD

## 2022-06-18 NOTE — Assessment & Plan Note (Signed)
A1c 5.9 in May 2022.  Repeat A1c ordered today.

## 2022-06-18 NOTE — Patient Instructions (Signed)
It was a pleasure to see you today.  Thank you for giving [place] the opportunity to be involved in your care.  Below is a brief recap of your visit and next steps.  We will plan to see you again in 3 months.  Summary You have established care today We will check labs and plan for follow up in 3 months I recommend trying Nyquil for night time cough relief

## 2022-06-18 NOTE — Assessment & Plan Note (Signed)
Recent hospital admission 1/1 - 1/3 for treatment of pyelonephritis.  She is currently being treated with [facility] and will complete treatment on 1/10.  She denies urinary symptoms or persistent flank pain today.

## 2022-06-18 NOTE — Assessment & Plan Note (Addendum)
Presenting today to establish care.  Previous records and labs been reviewed. -Repeat baseline labs ordered today.  These will be completed in 1 week. -I recommended she receive COVID-19 vaccines at her pharmacy and notify us when this is completed.  Preventative care items are otherwise up-to-date at this time. -We will tentatively plan for follow-up in 3 months

## 2022-06-18 NOTE — Assessment & Plan Note (Signed)
Lipid panel completed in May 2022.  Total cholesterol 201 and LDL 128.  A repeat lipid panel has been ordered today.  I recommended Kristen Kline incorporate elements of the Mediterranean diet to help with improving her cholesterol.

## 2022-06-18 NOTE — Assessment & Plan Note (Signed)
Previously on iron supplementation but is not currently taking any.  Repeat labs ordered today.

## 2022-06-20 ENCOUNTER — Other Ambulatory Visit (INDEPENDENT_AMBULATORY_CARE_PROVIDER_SITE_OTHER): Payer: Medicaid Other

## 2022-06-20 ENCOUNTER — Telehealth: Payer: Self-pay | Admitting: Internal Medicine

## 2022-06-20 ENCOUNTER — Ambulatory Visit: Payer: Self-pay

## 2022-06-20 ENCOUNTER — Other Ambulatory Visit: Payer: Self-pay

## 2022-06-20 DIAGNOSIS — R300 Dysuria: Secondary | ICD-10-CM

## 2022-06-20 LAB — POCT URINALYSIS DIP (CLINITEK)
Bilirubin, UA: NEGATIVE
Blood, UA: NEGATIVE
Glucose, UA: NEGATIVE mg/dL
Ketones, POC UA: NEGATIVE mg/dL
Leukocytes, UA: NEGATIVE
Nitrite, UA: NEGATIVE
POC PROTEIN,UA: NEGATIVE
Spec Grav, UA: 1.01 (ref 1.010–1.025)
Urobilinogen, UA: 0.2 E.U./dL
pH, UA: 6.5 (ref 5.0–8.0)

## 2022-06-20 NOTE — Telephone Encounter (Signed)
  Chief Complaint: UTI FU Symptoms: still having urination pain and pressure before and after urinating. Feeling chills and hot flashes and L sided back pain  Frequency: ongoing since 06/11/22 Pertinent Negatives: Patient denies fever Disposition: [] ED /[] Urgent Care (no appt availability in office) / [] Appointment(In office/virtual)/ []  [facility]/ [] Home Care/ [] Refused Recommended Disposition /[] [facility]/ [x]  Follow-up with PCP Additional Notes: pt states she completed last dose of Cefdinir today and just not feeling much better. Unsure if chills is from UTI or cold but wanting to know what else she needs to go. I advised pt to call PCP office back and see if can schedule appt, if unable can call [facility] # back and schedule Virtual UC thru Mychart. Pt verbalized understanding.   Reason for Disposition  [1] Taking antibiotic > 72 hours (3 days) for UTI AND [2] painful urination or frequency is SAME (unchanged, not better)  Answer Assessment - Initial Assessment Questions 1. MAIN SYMPTOM: "What is the main symptom you are concerned about?" (e.g., painful urination, urine frequency)     Painful urination pressure  2. BETTER-SAME-WORSE: "Are you getting better, staying the same, or getting worse compared to how you felt at your last visit to the doctor (most recent medical visit)?"     Same  3. PAIN: "How bad is the pain?"  (e.g., Scale 1-10; mild, moderate, or severe)   - MILD (1-3): complains slightly about urination hurting   - MODERATE (4-7): interferes with normal activities     - SEVERE (8-10): excruciating, unwilling or unable to urinate because of the pain      5 4. FEVER: "Do you have a fever?" If Yes, ask: "What is it, how was it measured, and when did it start?"     Unsure  5. OTHER SYMPTOMS: "Do you have any other symptoms?" (e.g., blood in the urine, flank pain, vaginal discharge)     Chills and hot flashes back pain L side  6. DIAGNOSIS: "When was the UTI  diagnosed?" "By whom?" "Was it a kidney infection, bladder infection or both?"     Kidney infection, went to ED 06/11/22 7. ANTIBIOTIC: "What antibiotic(s) are you taking?" "How many times per day?"     Cefidinir BID x 7 days  8. ANTIBIOTIC - START DATE: "When did you start taking the antibiotic?"     Started 1/1/4  Protocols used: Urinary Tract Infection on Antibiotic Follow-up Call - [facility]

## 2022-06-20 NOTE — Telephone Encounter (Signed)
Pt called asking if nurse can please give her a call? She has some questions she would like to ask.

## 2022-06-21 ENCOUNTER — Ambulatory Visit (INDEPENDENT_AMBULATORY_CARE_PROVIDER_SITE_OTHER): Payer: Medicaid Other | Admitting: Internal Medicine

## 2022-06-21 VITALS — BP 115/61 | HR 83 | Ht 66.0 in | Wt 316.0 lb

## 2022-06-21 DIAGNOSIS — J988 Other specified respiratory disorders: Secondary | ICD-10-CM | POA: Diagnosis not present

## 2022-06-21 DIAGNOSIS — J069 Acute upper respiratory infection, unspecified: Secondary | ICD-10-CM | POA: Diagnosis not present

## 2022-06-21 DIAGNOSIS — B9789 Other viral agents as the cause of diseases classified elsewhere: Secondary | ICD-10-CM | POA: Diagnosis not present

## 2022-06-21 DIAGNOSIS — Z87448 Personal history of other diseases of urinary system: Secondary | ICD-10-CM

## 2022-06-21 NOTE — Progress Notes (Signed)
Acute Office Visit  Subjective:     Patient ID: Kristen Kline, female    DOB: 06-20-1985, 37 y.o.   MRN: [id number]  Chief Complaint  Patient presents with   Urinary Tract Infection    Still having burning and pain in lower back. Finished abx 06/20/2022.   Ms. Durante presents today for an acute visit to discuss symptoms of subjective fever/chills and upper respiratory symptoms.  She was recently admitted to [facility] 1/1 - 1/3 for pyelonephritis.  She was treated with Omnicef, finishing 10-day treatment on 1/10.  Urine culture from this admission grew E. coli resistant to ampicillin and intermediate resistance to ampicillin/sulbactam.  I last evaluated her on Monday, 1/8.  Her symptoms had significantly improved by that time.  [name] currently denies dysuria, increased urinary frequency, foul odo or abnormal coloration of urine.  She denies flank pain.  She endorses chills but has not checked her temperature.  [name] and has also experienced nasal/sinus congestion with clear nasal secretions.  She endorses throat irritation today as well as a cough that is intermittently productive of clear sputum.  She is unaware of any recent sick contacts.  Review of Systems  Constitutional:  Positive for chills and malaise/fatigue. Negative for fever.  HENT:  Positive for congestion, sinus pain and sore throat. Negative for ear discharge and ear pain.   Respiratory:  Positive for cough and sputum production. Negative for shortness of breath and wheezing.   Genitourinary:  Negative for dysuria, flank pain, frequency, hematuria and urgency.  Musculoskeletal:  Negative for back pain.      Objective:    BP 115/61   Pulse 83   Ht 5\' 6"  (1.676 m)   Wt (!) 316 lb (143.3 kg)   LMP 05/17/2022 (Approximate) Comment: negative u-preg  SpO2 94%   BMI 51.00 kg/m   Physical Exam Constitutional:      General: She is not in acute distress.    Appearance: Normal appearance.  She is obese. She is not toxic-appearing.  HENT:     Head: Normocephalic and atraumatic.     Right Ear: External ear normal.     Left Ear: External ear normal.     Nose: Congestion present. No rhinorrhea.     Mouth/Throat:     Mouth: Mucous membranes are moist.     Pharynx: Oropharynx is clear. Posterior oropharyngeal erythema present. No oropharyngeal exudate.  Eyes:     General: No scleral icterus.    Extraocular Movements: Extraocular movements intact.     Conjunctiva/sclera: Conjunctivae normal.     Pupils: Pupils are equal, round, and reactive to light.  Cardiovascular:     Rate and Rhythm: Normal rate and regular rhythm.     Pulses: Normal pulses.     Heart sounds: Normal heart sounds. No murmur heard.    No friction rub. No gallop.  Pulmonary:     Effort: Pulmonary effort is normal.     Breath sounds: Normal breath sounds. No wheezing, rhonchi or rales.  Abdominal:     General: Abdomen is flat. Bowel sounds are normal. There is no distension.     Palpations: Abdomen is soft.     Tenderness: There is no abdominal tenderness. There is no right CVA tenderness or left CVA tenderness.  Musculoskeletal:        General: No swelling. Normal range of motion.     Cervical back: Normal range of motion.     Right lower leg: No  edema.     Left lower leg: No edema.  Lymphadenopathy:     Cervical: No cervical adenopathy.  Skin:    General: Skin is warm and dry.     Capillary Refill: Capillary refill takes less than 2 seconds.     Coloration: Skin is not jaundiced.  Neurological:     General: No focal deficit present.     Mental Status: She is alert and oriented to person, place, and time.  Psychiatric:        Mood and Affect: Mood normal.        Behavior: Behavior normal.       Assessment & Plan:   Problem List Items Addressed This Visit       Viral respiratory illness    Presenting today for evaluation of the symptoms endorsed above.  Her symptoms seem more consistent  with a viral respiratory infection as opposed to lingering symptoms of pyelonephritis.  My suspicion is that she was exposed during her recent hospital admission or ED presentation. -No indication for additional antibiotic therapy currently -I have recommended supportive treatment measures for now, including Tylenol for fever/pain relief, adequate hydration, and OTC cough/cold medications. -She was instructed to return to care if she develops fever, urinary symptoms returned, or her cough becomes productive of thick yellow/green sputum      History of pyelonephritis - Primary    She currently denies urinary symptoms.  Urine culture from her recent admission 1/1 - 1/3 subsequently grew E. coli resistant to ampicillin and with intermediate resistance to ampicillin/sulbactam.  She was treated with [facility] for an appropriate duration of time.  I do not believe that her current symptoms are attributable to persistent pyelonephritis.      Return if symptoms worsen or fail to improve.  Johnette Abraham, MD

## 2022-06-21 NOTE — Patient Instructions (Signed)
It was a pleasure to see you today.  Thank you for giving [place] the opportunity to be involved in your care.  Below is a brief recap of your visit and next steps.  We will plan to see you again in April  Summary No additional antibiotic treatment is indicated currently I recommend taking tylenol as needed for pain / fever relief Be sure to stay well-hydrated You should return to care if your urinary symptoms return, your cough becomes productive of thick yellow/green sputum, or if you begin to feel worse in general

## 2022-06-22 ENCOUNTER — Encounter: Payer: Self-pay | Admitting: Internal Medicine

## 2022-06-22 DIAGNOSIS — B9789 Other viral agents as the cause of diseases classified elsewhere: Secondary | ICD-10-CM | POA: Insufficient documentation

## 2022-06-22 NOTE — Assessment & Plan Note (Signed)
Presenting today for evaluation of the symptoms endorsed above.  Her symptoms seem more consistent with a viral respiratory infection as opposed to lingering symptoms of pyelonephritis.  My suspicion is that she was exposed during her recent hospital admission or ED presentation. -No indication for additional antibiotic therapy currently -I have recommended supportive treatment measures for now, including Tylenol for fever/pain relief, adequate hydration, and OTC cough/cold medications. -She was instructed to return to care if she develops fever, urinary symptoms returned, or her cough becomes productive of thick yellow/green sputum

## 2022-06-22 NOTE — Assessment & Plan Note (Signed)
She currently denies urinary symptoms.  Urine culture from her recent admission 1/1 - 1/3 subsequently grew E. coli resistant to ampicillin and with intermediate resistance to ampicillin/sulbactam.  She was treated with [facility] for an appropriate duration of time.  I do not believe that her current symptoms are attributable to persistent pyelonephritis.

## 2022-07-03 LAB — CBC WITH DIFFERENTIAL/PLATELET
Basophils Absolute: 0.1 10*3/uL (ref 0.0–0.2)
Basos: 1 %
EOS (ABSOLUTE): 0.3 10*3/uL (ref 0.0–0.4)
Eos: 4 %
Hematocrit: 34.6 % (ref 34.0–46.6)
Hemoglobin: 11.6 g/dL (ref 11.1–15.9)
Immature Grans (Abs): 0 10*3/uL (ref 0.0–0.1)
Immature Granulocytes: 0 %
Lymphocytes Absolute: 3.2 10*3/uL — ABNORMAL HIGH (ref 0.7–3.1)
Lymphs: 48 %
MCH: 26.9 pg (ref 26.6–33.0)
MCHC: 33.5 g/dL (ref 31.5–35.7)
MCV: 80 fL (ref 79–97)
Monocytes Absolute: 0.3 10*3/uL (ref 0.1–0.9)
Monocytes: 5 %
Neutrophils Absolute: 2.8 10*3/uL (ref 1.4–7.0)
Neutrophils: 42 %
Platelets: 624 10*3/uL — ABNORMAL HIGH (ref 150–450)
RBC: 4.32 x10E6/uL (ref 3.77–5.28)
RDW: 12.7 % (ref 11.7–15.4)
WBC: 6.6 10*3/uL (ref 3.4–10.8)

## 2022-07-03 LAB — CMP14+EGFR
ALT: 18 IU/L (ref 0–32)
AST: 16 IU/L (ref 0–40)
Albumin/Globulin Ratio: 1.4 (ref 1.2–2.2)
Albumin: 4.2 g/dL (ref 3.9–4.9)
Alkaline Phosphatase: 70 IU/L (ref 44–121)
BUN/Creatinine Ratio: 11 (ref 9–23)
BUN: 8 mg/dL (ref 6–20)
Bilirubin Total: 0.3 mg/dL (ref 0.0–1.2)
CO2: 19 mmol/L — ABNORMAL LOW (ref 20–29)
Calcium: 9.4 mg/dL (ref 8.7–10.2)
Chloride: 105 mmol/L (ref 96–106)
Creatinine, Ser: 0.74 mg/dL (ref 0.57–1.00)
Globulin, Total: 3 g/dL (ref 1.5–4.5)
Glucose: 95 mg/dL (ref 70–99)
Potassium: 4.4 mmol/L (ref 3.5–5.2)
Sodium: 140 mmol/L (ref 134–144)
Total Protein: 7.2 g/dL (ref 6.0–8.5)
eGFR: 109 mL/min/{1.73_m2} (ref >59)

## 2022-07-03 LAB — LIPID PANEL
Chol/HDL Ratio: 4.5 ratio — ABNORMAL HIGH (ref 0.0–4.4)
Cholesterol, Total: 192 mg/dL (ref 100–199)
HDL: 43 mg/dL (ref >39)
LDL Chol Calc (NIH): 132 mg/dL — ABNORMAL HIGH (ref 0–99)
Triglycerides: 94 mg/dL (ref 0–149)
VLDL Cholesterol Cal: 17 mg/dL (ref 5–40)

## 2022-07-03 LAB — IRON,TIBC AND FERRITIN PANEL
Ferritin: 55 ng/mL (ref 15–150)
Iron Saturation: 14 % — ABNORMAL LOW (ref 15–55)
Iron: 35 ug/dL (ref 27–159)
Total Iron Binding Capacity: 256 ug/dL (ref 250–450)
UIBC: 221 ug/dL (ref 131–425)

## 2022-07-03 LAB — HEMOGLOBIN A1C
Est. average glucose Bld gHb Est-mCnc: 105 mg/dL
Hgb A1c MFr Bld: 5.3 % (ref 4.8–5.6)

## 2022-07-03 LAB — B12 AND FOLATE PANEL
Folate: 14.3 ng/mL (ref >3.0)
Vitamin B-12: 854 pg/mL (ref 232–1245)

## 2022-07-03 LAB — VITAMIN D 25 HYDROXY (VIT D DEFICIENCY, FRACTURES): Vit D, 25-Hydroxy: 27.1 ng/mL — ABNORMAL LOW (ref 30.0–100.0)

## 2022-07-03 LAB — TSH+FREE T4
Free T4: 1.37 ng/dL (ref 0.82–1.77)
TSH: 0.741 u[IU]/mL (ref 0.450–4.500)

## 2022-07-05 LAB — UA/M W/RFLX CULTURE, ROUTINE
Bilirubin, UA: NEGATIVE
Glucose, UA: NEGATIVE
Ketones, UA: NEGATIVE
Nitrite, UA: NEGATIVE
Specific Gravity, UA: 1.02 (ref 1.005–1.030)
Urobilinogen, Ur: 0.2 mg/dL (ref 0.2–1.0)
pH, UA: 5.5 (ref 5.0–7.5)

## 2022-07-05 LAB — MICROSCOPIC EXAMINATION: Epithelial Cells (non renal): >10 /hpf — AB (ref 0–10)

## 2022-07-05 LAB — URINE CULTURE, REFLEX

## 2022-07-06 ENCOUNTER — Ambulatory Visit (INDEPENDENT_AMBULATORY_CARE_PROVIDER_SITE_OTHER): Payer: Medicaid Other | Admitting: Internal Medicine

## 2022-07-06 ENCOUNTER — Encounter: Payer: Self-pay | Admitting: Internal Medicine

## 2022-07-06 VITALS — BP 128/78 | HR 92 | Ht 66.0 in | Wt 301.4 lb

## 2022-07-06 DIAGNOSIS — E611 Iron deficiency: Secondary | ICD-10-CM | POA: Diagnosis not present

## 2022-07-06 DIAGNOSIS — M722 Plantar fascial fibromatosis: Secondary | ICD-10-CM | POA: Insufficient documentation

## 2022-07-06 DIAGNOSIS — E559 Vitamin D deficiency, unspecified: Secondary | ICD-10-CM

## 2022-07-06 DIAGNOSIS — M7661 Achilles tendinitis, right leg: Secondary | ICD-10-CM | POA: Insufficient documentation

## 2022-07-06 DIAGNOSIS — E7841 Elevated Lipoprotein(a): Secondary | ICD-10-CM

## 2022-07-06 MED ORDER — MELOXICAM 7.5 MG PO TABS: 7.5000 mg | ORAL_TABLET | Freq: Every day | ORAL | 0 refills | Status: AC

## 2022-07-06 NOTE — Progress Notes (Signed)
Acute Office Visit  Subjective:     Patient ID: Kristen Kline, female    DOB: Jun 13, 1985, 37 y.o.   MRN: [id number]  Chief Complaint  Patient presents with   lab    Follow up, issues with heart or breathing not sure which one   Kristen Kline presents for an acute visit today requesting to review recent labs and to discuss bilateral foot and ankle pain as well as shortness of breath.  She was last evaluated by me on 1/11 for an acute visit at which time she endorsed fever/chills and upper respiratory symptoms.  I recommended supportive treatment measures for a viral respiratory illness.  Baseline lab work was repeated on 1/22.  There have otherwise been no acute interval events.  Kristen Kline reports a several week history of bilateral ankle pain.  She initially experienced pain along the plantar aspect of her left foot.  There was no inciting event or trauma at the onset of pain.  This caused her to favor her right foot more, and more recently she has begun to experience pain along her right heel.  She additionally endorses shortness of breath with attempting to exercise.  She had previously tried to increase her exercise capacity but had to stop during recent hospital admission for pyelonephritis.  Review of Systems  Constitutional:  Positive for malaise/fatigue.  Respiratory:  Positive for shortness of breath (With exertion).   Musculoskeletal:        Bilateral foot/ankle pain  All other systems reviewed and are negative.     Objective:    BP 128/78   Pulse 92   Ht 5\' 6"  (1.676 m)   Wt (!) 301 lb 6.4 oz (136.7 kg)   LMP 05/17/2022 (Approximate) Comment: negative u-preg  SpO2 98%   BMI 48.65 kg/m   Physical Exam Vitals reviewed.  Constitutional:      General: She is not in acute distress.    Appearance: Normal appearance. She is obese. She is not toxic-appearing.  HENT:     Head: Normocephalic and atraumatic.     Right Ear: External ear normal.     Left Ear:  External ear normal.     Nose: Nose normal. No congestion or rhinorrhea.     Mouth/Throat:     Mouth: Mucous membranes are moist.     Pharynx: Oropharynx is clear. No oropharyngeal exudate or posterior oropharyngeal erythema.  Eyes:     General: No scleral icterus.    Extraocular Movements: Extraocular movements intact.     Conjunctiva/sclera: Conjunctivae normal.     Pupils: Pupils are equal, round, and reactive to light.  Cardiovascular:     Rate and Rhythm: Normal rate and regular rhythm.     Pulses: Normal pulses.     Heart sounds: Normal heart sounds. No murmur heard.    No friction rub. No gallop.  Pulmonary:     Effort: Pulmonary effort is normal.     Breath sounds: Normal breath sounds. No wheezing, rhonchi or rales.  Abdominal:     General: Abdomen is flat. Bowel sounds are normal. There is no distension.     Palpations: Abdomen is soft.     Tenderness: There is no abdominal tenderness.  Musculoskeletal:        General: No swelling. Normal range of motion.     Cervical back: Normal range of motion.     Right lower leg: No edema.     Left lower leg: No edema.  Comments: ROM of both ankles is intact.  There is tenderness palpation along the plantar fascia of the left foot.  There is tenderness palpation along the Achilles tendon of the right foot proximal to the site of insertion of the right heel.  Negative Thompson's.  Lymphadenopathy:     Cervical: No cervical adenopathy.  Skin:    General: Skin is warm and dry.     Capillary Refill: Capillary refill takes less than 2 seconds.     Coloration: Skin is not jaundiced.  Neurological:     General: No focal deficit present.     Mental Status: She is alert and oriented to person, place, and time.  Psychiatric:        Mood and Affect: Mood normal.        Behavior: Behavior normal.       Assessment & Plan:   Problem List Items Addressed This Visit       Achilles tendinitis, right leg - Primary    She endorses  recent development of pain along her right heel.  She has tenderness palpation over the Achilles tendon.  Negative Thompson squeeze.  Her exam and history today seem most consistent with Achilles tendinitis. -I have prescribed meloxicam 7.5 mg daily x 14 days and provided home PT exercises -She will return to care if her symptoms not improve      Plantar fasciitis of left foot    He currently endorses pain along the plantar surface of the left foot.  There is tenderness palpation along the plantar fascia, which seems most consistent with plantar fasciitis. -I have prescribed meloxicam 7.5 mg daily x 14 days.  She has also been provided with home PT exercises. -She will return to care if her symptoms do not improve      Hyperlipidemia    Lipid panel updated earlier this month.  LDL 132.  I have again recommended that she limit fatty/fried foods and incorporate regular exercise into her weekly routine.      Iron deficiency    Recent iron studies were consistent with iron deficiency.  She reports that she has started daily iron supplementation as recommended.      Vitamin D insufficiency    Noted on recent labs.  She states that she has started daily vitamin D supplementation as recommended.       Meds ordered this encounter  Medications   meloxicam (MOBIC) 7.5 MG tablet    Sig: Take 1 tablet (7.5 mg total) by mouth daily for 14 days.    Dispense:  14 tablet    Refill:  0    Return if symptoms worsen or fail to improve.  Johnette Abraham, MD

## 2022-07-06 NOTE — Patient Instructions (Signed)
It was a pleasure to see you today.  Thank you for giving [place] the opportunity to be involved in your care.  Below is a brief recap of your visit and next steps.  We will plan to see you again in April.  Summary Please see the attached exercises for your feet I have prescribed meloxicam for the next two weeks I recommend purchasing heel pas inserts for your shoes

## 2022-07-06 NOTE — Assessment & Plan Note (Signed)
She endorses recent development of pain along her right heel.  She has tenderness palpation over the Achilles tendon.  Negative Thompson squeeze.  Her exam and history today seem most consistent with Achilles tendinitis. -I have prescribed meloxicam 7.5 mg daily x 14 days and provided home PT exercises -She will return to care if her symptoms not improve

## 2022-07-06 NOTE — Assessment & Plan Note (Signed)
He currently endorses pain along the plantar surface of the left foot.  There is tenderness palpation along the plantar fascia, which seems most consistent with plantar fasciitis. -I have prescribed meloxicam 7.5 mg daily x 14 days.  She has also been provided with home PT exercises. -She will return to care if her symptoms do not improve

## 2022-07-06 NOTE — Assessment & Plan Note (Signed)
Noted on recent labs.  She states that she has started daily vitamin D supplementation as recommended.

## 2022-07-06 NOTE — Assessment & Plan Note (Signed)
Recent iron studies were consistent with iron deficiency.  She reports that she has started daily iron supplementation as recommended.

## 2022-07-06 NOTE — Assessment & Plan Note (Signed)
Lipid panel updated earlier this month.  LDL 132.  I have again recommended that she limit fatty/fried foods and incorporate regular exercise into her weekly routine.

## 2022-07-16 ENCOUNTER — Ambulatory Visit: Payer: Self-pay | Admitting: Internal Medicine

## 2022-08-24 ENCOUNTER — Ambulatory Visit: Payer: Medicaid Other | Admitting: Family Medicine

## 2022-08-24 ENCOUNTER — Encounter: Payer: Self-pay | Admitting: Family Medicine

## 2022-08-24 ENCOUNTER — Ambulatory Visit (INDEPENDENT_AMBULATORY_CARE_PROVIDER_SITE_OTHER): Payer: Medicaid Other | Admitting: Family Medicine

## 2022-08-24 VITALS — BP 123/78 | HR 103 | Ht 66.0 in | Wt 313.0 lb

## 2022-08-24 DIAGNOSIS — R300 Dysuria: Secondary | ICD-10-CM | POA: Diagnosis not present

## 2022-08-24 LAB — POCT URINALYSIS DIP (CLINITEK)
Bilirubin, UA: NEGATIVE
Glucose, UA: NEGATIVE mg/dL
Ketones, POC UA: NEGATIVE mg/dL
Nitrite, UA: NEGATIVE
POC PROTEIN,UA: NEGATIVE
Spec Grav, UA: 1.025 (ref 1.010–1.025)
Urobilinogen, UA: 0.2 E.U./dL
pH, UA: 5.5 (ref 5.0–8.0)

## 2022-08-24 MED ORDER — SULFAMETHOXAZOLE-TRIMETHOPRIM 800-160 MG PO TABS: 1.0000 | ORAL_TABLET | Freq: Two times a day (BID) | ORAL | 0 refills | Status: DC

## 2022-08-24 NOTE — Patient Instructions (Signed)
F/U with PCP as before, call if you need to be seen sooner  Septra is prescribed for 1 week, it appears as if you have a UTI  I will send a message with your results and will request that you reply  Commit to drinking on avg 64 ounces water every day, and commit to emptying often  Thanks for choosing [facility], we consider it a privelige to serve you.

## 2022-08-26 ENCOUNTER — Encounter: Payer: Self-pay | Admitting: Family Medicine

## 2022-08-26 DIAGNOSIS — R300 Dysuria: Secondary | ICD-10-CM | POA: Insufficient documentation

## 2022-08-26 LAB — URINE CULTURE

## 2022-08-26 NOTE — Assessment & Plan Note (Signed)
  Patient re-educated about  the importance of commitment to a  minimum of 150 minutes of exercise per week as able.  The importance of healthy food choices with portion control discussed, as well as eating regularly and within a 12 hour window most days. The need to choose "clean , green" food 50 to 75% of the time is discussed, as well as to make water the primary drink and set a goal of 64 ounces water daily.       08/24/2022    2:54 PM 07/06/2022   11:00 AM 06/21/2022    3:08 PM  Weight /BMI  Weight 313 lb 301 lb 6.4 oz 316 lb  Height 5\' 6"  (1.676 m) 5\' 6"  (1.676 m) 5\' 6"  (1.676 m)  BMI 50.52 kg/m2 48.65 kg/m2 51 kg/m2    Deteriorated f/u with PCP as before

## 2022-08-26 NOTE — Progress Notes (Signed)
   Kristen Kline     MRN: [id number]      DOB: Oct 20, 1985   HPI Kristen Kline is here with a 1 week h/o urinary frequncy and mild dysuria. Denies nausea, vomit, fever or flank pain. Currently on menses , unsure of hematuria. Has had pyelonphritis in te past so is concerned With her current new job, she is unable to void fequently so this is a concern ROS Denies recent fever or chills. Denies sinus pressure, nasal congestion, ear pain or sore throat. Denies chest congestion, productive cough or wheezing. Denies abdominal pain, nausea, vomiting,diarrhea or constipation.    Denies depression or  anxiety  PE  BP 123/78 (BP Location: Right Arm, Patient Position: Sitting, Cuff Size: Large)   Pulse (!) 103   Ht 5\' 6"  (1.676 m)   Wt (!) 313 lb (142 kg)   SpO2 95%   BMI 50.52 kg/m   Patient alert and oriented and in no cardiopulmonary distress.  HEENT: No facial asymmetry, EOMI,     Neck supple .  Chest: Clear to auscultation bilaterally.  CVS: S1, S2 no murmurs, no S3.Regular rate.  ABD: Soft non tender. No renal angle tenderness  Ext: No edema  MS: Adequate ROM spine, shoulders, hips and knees.  d.  Psych: Good eye contact, normal affect. Memory intact not anxious or depressed appearing.  CNS: CN 2-12 intact, power,  normal throughout.no focal deficits noted.   Assessment & Plan  Dysuria CCUA is abnormal with pyelonephritis history, septra prescribed , f/u urine c/s Encouraged to ensure water intake goal 64 oz or more per day and emptying regularly  Morbid obesity ([place])  Patient re-educated about  the importance of commitment to a  minimum of 150 minutes of exercise per week as able.  The importance of healthy food choices with portion control discussed, as well as eating regularly and within a 12 hour window most days. The need to choose "clean , green" food 50 to 75% of the time is discussed, as well as to make water the primary drink and set a goal of 64  ounces water daily.       08/24/2022    2:54 PM 07/06/2022   11:00 AM 06/21/2022    3:08 PM  Weight /BMI  Weight 313 lb 301 lb 6.4 oz 316 lb  Height 5\' 6"  (1.676 m) 5\' 6"  (1.676 m) 5\' 6"  (1.676 m)  BMI 50.52 kg/m2 48.65 kg/m2 51 kg/m2    Deteriorated f/u with PCP as before

## 2022-08-26 NOTE — Assessment & Plan Note (Signed)
CCUA is abnormal with pyelonephritis history, septra prescribed , f/u urine c/s Encouraged to ensure water intake goal 64 oz or more per day and emptying regularly

## 2022-08-29 ENCOUNTER — Telehealth: Payer: Self-pay | Admitting: Internal Medicine

## 2022-08-29 NOTE — Telephone Encounter (Signed)
Called patient and no answer.

## 2022-08-29 NOTE — Telephone Encounter (Signed)
Pt called in requesting lab orders be placed since uti results were negative. Wants to see what is going on.  Wants a call  back in regard

## 2022-08-30 NOTE — Telephone Encounter (Signed)
See below

## 2022-08-30 NOTE — Telephone Encounter (Signed)
Called patient left voicemail and also sent a mychart message to contact office to schedule  an appointment thank you

## 2022-09-03 ENCOUNTER — Ambulatory Visit (INDEPENDENT_AMBULATORY_CARE_PROVIDER_SITE_OTHER): Payer: Medicaid Other | Admitting: Family Medicine

## 2022-09-03 ENCOUNTER — Encounter: Payer: Self-pay | Admitting: Family Medicine

## 2022-09-03 VITALS — BP 139/86 | HR 81 | Ht 66.0 in | Wt 315.0 lb

## 2022-09-03 DIAGNOSIS — R5383 Other fatigue: Secondary | ICD-10-CM | POA: Diagnosis not present

## 2022-09-03 DIAGNOSIS — D509 Iron deficiency anemia, unspecified: Secondary | ICD-10-CM

## 2022-09-03 MED ORDER — IRON (FERROUS SULFATE) 325 (65 FE) MG PO TABS: 325.0000 mg | ORAL_TABLET | Freq: Every day | ORAL | 2 refills | Status: AC

## 2022-09-03 NOTE — Progress Notes (Signed)
Patient Office Visit   Subjective   Patient ID: Kristen Kline, female    DOB: May 03, 1986  Age: 37 y.o. MRN: [id number]  CC:  Chief Complaint  Patient presents with   Fatigue    Patient complains of fatigue, feeling cold continued from 10 days ago.     HPI Kristen Kline 37 year old female presents to the clinic for fatigue and episodes of feeling cold at times. 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 Refer to plan and assessment for details of today's visit.   HPI    Outpatient Encounter Medications as of 09/03/2022  Medication Sig   albuterol (VENTOLIN HFA) 108 (90 Base) MCG/ACT inhaler Inhale 2 puffs into the lungs every 6 (six) hours as needed for wheezing or shortness of breath.   Iron, Ferrous Sulfate, 325 (65 Fe) MG TABS Take 325 mg by mouth daily.   sulfamethoxazole-trimethoprim (BACTRIM DS) 800-160 MG tablet Take 1 tablet by mouth 2 (two) times daily.   acetaminophen (TYLENOL) 500 MG tablet Take 1,000 mg by mouth 3 (three) times daily as needed for moderate pain. (Patient not taking: Reported on 09/03/2022)   ibuprofen (ADVIL) 200 MG tablet Take 400-600 mg by mouth 3 (three) times daily as needed for moderate pain. (Patient not taking: Reported on 09/03/2022)   No facility-administered encounter medications on file as of 09/03/2022.    Past Surgical History:  Procedure Laterality Date   CESAREAN SECTION N/A    [employer]reesia 11/17/2019   CHOLECYSTECTOMY     WISDOM TOOTH EXTRACTION      Review of Systems  Constitutional:  Positive for malaise/fatigue. Negative for chills and fever.  HENT:   Negative for tinnitus.   Respiratory:  Negative for shortness of breath.   Cardiovascular:  Negative for chest pain.  Gastrointestinal:  Negative for abdominal pain, nausea and vomiting.  Genitourinary:  Negative for dysuria, flank pain, frequency and urgency.  Musculoskeletal:  Negative for back pain.  Neurological:  Negative for dizziness and headaches.      Objective    BP 139/86   Pulse 81   Ht 5\' 6"  (1.676 m)   Wt (!) 315 lb (142.9 kg)   SpO2 96%   BMI 50.84 kg/m   Physical Exam HENT:     Head: Normocephalic.  Cardiovascular:     Rate and Rhythm: Normal rate.     Pulses: Normal pulses.  Pulmonary:     Effort: Pulmonary effort is normal.  Musculoskeletal:        General: Normal range of motion.  Skin:    General: Skin is warm and dry.     Capillary Refill: Capillary refill takes less than 2 seconds.  Neurological:     General: No focal deficit present.     Mental Status: She is alert.  Psychiatric:        Thought Content: Thought content normal.       Assessment & Plan:  Other fatigue Assessment & Plan: Advise patient eat a well-balanced diet, which includes lean proteins, whole grains, plenty of fruits and vegetables, and low-fat dairy products. Avoid eating or drinking too many products with caffeine in them. Advise Drinking  enough fluid to keep your urine pale yellow. 8 glasses  of water a day Explained the importance of taking iron and vitamin D tablets daily.   Iron deficiency anemia, unspecified iron deficiency anemia type -     Iron (Ferrous Sulfate); Take 325 mg by mouth daily.  Dispense: 30 tablet; Refill: 2    No follow-ups on file.   Renard Hamper Ria Comment, FNP

## 2022-09-03 NOTE — Assessment & Plan Note (Addendum)
Advise patient eat a well-balanced diet, which includes lean proteins, whole grains, plenty of fruits and vegetables, and low-fat dairy products. Avoid eating or drinking too many products with caffeine in them. Advise Drinking  enough fluid to keep your urine pale yellow. 8 glasses of water a day Explained the importance of taking iron and vitamin D tablets daily.

## 2022-09-03 NOTE — Patient Instructions (Signed)
It was pleasure meeting with you today. Please take vitamins as prescribed. Follow up with your primary health provider if any health concerns arises.

## 2022-09-19 ENCOUNTER — Ambulatory Visit: Payer: Medicaid Other | Admitting: Internal Medicine

## 2022-09-26 ENCOUNTER — Ambulatory Visit (INDEPENDENT_AMBULATORY_CARE_PROVIDER_SITE_OTHER): Payer: Medicaid Other | Admitting: Internal Medicine

## 2022-09-26 ENCOUNTER — Encounter: Payer: Self-pay | Admitting: Internal Medicine

## 2022-09-26 VITALS — BP 118/77 | HR 89 | Ht 66.0 in | Wt 316.2 lb

## 2022-09-26 DIAGNOSIS — D75839 Thrombocytosis, unspecified: Secondary | ICD-10-CM | POA: Diagnosis not present

## 2022-09-26 DIAGNOSIS — E7841 Elevated Lipoprotein(a): Secondary | ICD-10-CM

## 2022-09-26 DIAGNOSIS — Z6841 Body Mass Index (BMI) 40.0 and over, adult: Secondary | ICD-10-CM

## 2022-09-26 DIAGNOSIS — E611 Iron deficiency: Secondary | ICD-10-CM

## 2022-09-26 NOTE — Assessment & Plan Note (Signed)
Noted on CBC from January.  Likely due to iron deficiency. -Repeat CBC ordered today

## 2022-09-26 NOTE — Assessment & Plan Note (Signed)
BMI 51.  We again reviewed the imperative need to lose weight.  Dietary changes addressed as noted above.  We also discussed the importance of regular exercise.  She has not been exercising since injuring her foot in January.   -We discussed a gradual return to moderate intensity exercise with a goal of at least 150 minutes of moderate intensity exercise on a weekly basis.

## 2022-09-26 NOTE — Assessment & Plan Note (Signed)
Lipid panel updated in January.  Total cholesterol 192 and LDL 132.  We again reviewed dietary changes aimed at improving her cholesterol.  Recommended limiting fatty/fried foods and incorporating more lean protein and fruit into her regular diet.  She endorsed difficulty abstaining from fast food. -Repeat lipid panel at follow-up in 6 months

## 2022-09-26 NOTE — Patient Instructions (Signed)
It was a pleasure to see you today.  Thank you for giving [place] the opportunity to be involved in your care.  Below is a brief recap of your visit and next steps.  We will plan to see you again in 6 months.  Summary No medication changes today Repeat iron studies and cell counts Follow up in 6 months

## 2022-09-26 NOTE — Progress Notes (Signed)
Established Patient Office Visit  Subjective   Patient ID: Kristen Kline, female    DOB: [birth year]  Age: 37 y.o. MRN: [id number]  Chief Complaint  Patient presents with   Follow-up    3 month   Kristen Kline returns to care today for routine follow-up.  Last evaluated at [facility] on 3/25 endorsing symptoms of fatigue.  There have been no acute interval events since her last appointment.  Kristen Kline reports feeling well today.  She is asymptomatic and has no acute concerns to discuss.  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/02/2020   Sepsis due to urinary tract infection 06/11/2022   UTI (lower urinary tract infection) 05/30/2015   Past Surgical History:  Procedure Laterality Date   CESAREAN SECTION N/A    Phreesia 11/17/2019   CHOLECYSTECTOMY     WISDOM TOOTH EXTRACTION     Social History   Tobacco Use   Smoking status: Former   Smokeless tobacco: Never  Building services engineer Use: Never used  Substance Use Topics   Alcohol use: No   Drug use: No   Family History  Problem Relation Age of Onset   Heart failure Mother    Diabetes Mother    Hypertension Father    Stroke Paternal Grandfather    Stroke Paternal Grandmother    No Known Allergies  Review of Systems  Constitutional:  Negative for chills and fever.  HENT:  Negative for sore throat.   Respiratory:  Negative for cough and shortness of breath.   Cardiovascular:  Negative for chest pain, palpitations and leg swelling.  Gastrointestinal:  Negative for abdominal pain, blood in stool, constipation, diarrhea, nausea and vomiting.  Genitourinary:  Negative for dysuria and hematuria.  Musculoskeletal:  Negative for myalgias.  Skin:  Negative  for itching and rash.  Neurological:  Negative for dizziness and headaches.  Psychiatric/Behavioral:  Negative for depression and suicidal ideas.      Objective:     BP 118/77   Pulse 89   Ht  (1.676 m)   Wt (!) 316 lb 3.2 oz (143.4 kg)   SpO2 96%   BMI 51.04 kg/m  BP Readings from Last 3 Encounters:  09/26/22 118/77  09/03/22 139/86  08/24/22 123/78   Physical Exam Vitals reviewed.  Constitutional:      General: She is not in acute distress.    Appearance: Normal appearance. She is obese. She is not toxic-appearing.  HENT:     Head: Normocephalic and atraumatic.     Right Ear: External ear normal.     Left Ear: External ear normal.     Nose: Nose normal. No congestion or rhinorrhea.     Mouth/Throat:     Mouth: Mucous membranes are moist.     Pharynx: Oropharynx is clear. No oropharyngeal exudate or posterior oropharyngeal erythema.  Eyes:     General: No scleral icterus.    Extraocular Movements: Extraocular movements intact.     Conjunctiva/sclera: Conjunctivae normal.     Pupils: Pupils are equal, round, and reactive to light.  Cardiovascular:     Rate and Rhythm: Normal rate and regular rhythm.     Pulses: Normal pulses.  Heart sounds: Normal heart sounds. No murmur heard.    No friction rub. No gallop.  Pulmonary:     Effort: Pulmonary effort is normal.     Breath sounds: Normal breath sounds. No wheezing, rhonchi or rales.  Abdominal:     General: Abdomen is flat. Bowel sounds are normal. There is no distension.     Palpations: Abdomen is soft.     Tenderness: There is no abdominal tenderness.  Musculoskeletal:        General: No swelling. Normal range of motion.     Cervical back: Normal range of motion.     Right lower leg: No edema.     Left lower leg: No edema.  Lymphadenopathy:     Cervical: No cervical adenopathy.  Skin:    General: Skin is warm and dry.     Capillary Refill: Capillary refill takes less than 2 seconds.     Coloration:  Skin is not jaundiced.  Neurological:     General: No focal deficit present.     Mental Status: She is alert and oriented to person, place, and time.  Psychiatric:        Mood and Affect: Mood normal.        Behavior: Behavior normal.   Last CBC Lab Results  Component Value Date   WBC 6.6 07/02/2022   HGB 11.6 07/02/2022   HCT 34.6 07/02/2022   MCV 80 07/02/2022   MCH 26.9 07/02/2022   RDW 12.7 07/02/2022   PLT 624 (H) 07/02/2022   Last metabolic panel Lab Results  Component Value Date   GLUCOSE 95 07/02/2022   NA 140 07/02/2022   K 4.4 07/02/2022   CL 105 07/02/2022   CO2 19 (L) 07/02/2022   BUN 8 07/02/2022   CREATININE 0.74 07/02/2022   EGFR 109 07/02/2022   CALCIUM 9.4 07/02/2022   PHOS 4.0 10/13/2019   PROT 7.2 07/02/2022   ALBUMIN 4.2 07/02/2022   LABGLOB 3.0 07/02/2022   AGRATIO 1.4 07/02/2022   BILITOT 0.3 07/02/2022   ALKPHOS 70 07/02/2022   AST 16 07/02/2022   ALT 18 07/02/2022   ANIONGAP 8 06/13/2022   Last lipids Lab Results  Component Value Date   CHOL 192 07/02/2022   HDL 43 07/02/2022   LDLCALC 132 (H) 07/02/2022   TRIG 94 07/02/2022   CHOLHDL 4.5 (H) 07/02/2022   Last hemoglobin A1c Lab Results  Component Value Date   HGBA1C 5.3 07/02/2022   Last thyroid functions Lab Results  Component Value Date   TSH 0.741 07/02/2022   Last vitamin D Lab Results  Component Value Date   VD25OH 27.1 (L) 07/02/2022   Last vitamin B12 and Folate Lab Results  Component Value Date   VITAMINB12 854 07/02/2022   FOLATE 14.3 07/02/2022     Assessment & Plan:   Problem List Items Addressed This Visit       Thrombocytosis    Noted on CBC from January.  Likely due to iron deficiency. -Repeat CBC ordered today      Morbid obesity with BMI of 50.0-59.9, adult    BMI 51.  We again reviewed the imperative need to lose weight.  Dietary changes addressed as noted above.  We also discussed the importance of regular exercise.  She has not been  exercising since injuring her foot in January.   -We discussed a gradual return to moderate intensity exercise with a goal of at least 150 minutes of moderate intensity exercise on a weekly  basis.      Hyperlipidemia    Lipid panel updated in January.  Total cholesterol 192 and LDL 132.  We again reviewed dietary changes aimed at improving her cholesterol.  Recommended limiting fatty/fried foods and incorporating more lean protein and fruit into her regular diet.  She endorsed difficulty abstaining from fast food. -Repeat lipid panel at follow-up in 6 months      Iron deficiency - Primary    Iron studies from January were consistent with iron deficiency.  She remains on daily iron supplementation. -Repeat iron studies ordered today       Return in about 6 months (around 03/28/2023).    Billie Lade, MD

## 2022-09-26 NOTE — Assessment & Plan Note (Signed)
Iron studies from January were consistent with iron deficiency.  She remains on daily iron supplementation. -Repeat iron studies ordered today

## 2022-10-02 LAB — CBC WITH DIFFERENTIAL/PLATELET
Basophils Absolute: 0.1 10*3/uL (ref 0.0–0.2)
Basos: 1 %
EOS (ABSOLUTE): 0.3 10*3/uL (ref 0.0–0.4)
Eos: 3 %
Hematocrit: 37.5 % (ref 34.0–46.6)
Hemoglobin: 12.4 g/dL (ref 11.1–15.9)
Immature Grans (Abs): 0 10*3/uL (ref 0.0–0.1)
Immature Granulocytes: 0 %
Lymphocytes Absolute: 3.4 10*3/uL — ABNORMAL HIGH (ref 0.7–3.1)
Lymphs: 34 %
MCH: 27.5 pg (ref 26.6–33.0)
MCHC: 33.1 g/dL (ref 31.5–35.7)
MCV: 83 fL (ref 79–97)
Monocytes Absolute: 0.3 10*3/uL (ref 0.1–0.9)
Monocytes: 3 %
Neutrophils Absolute: 5.7 10*3/uL (ref 1.4–7.0)
Neutrophils: 59 %
Platelets: 460 10*3/uL — ABNORMAL HIGH (ref 150–450)
RBC: 4.51 x10E6/uL (ref 3.77–5.28)
RDW: 15.4 % (ref 11.7–15.4)
WBC: 9.7 10*3/uL (ref 3.4–10.8)

## 2022-10-02 LAB — IRON,TIBC AND FERRITIN PANEL
Ferritin: 76 ng/mL (ref 15–150)
Iron Saturation: 12 % — ABNORMAL LOW (ref 15–55)
Iron: 31 ug/dL (ref 27–159)
Total Iron Binding Capacity: 262 ug/dL (ref 250–450)
UIBC: 231 ug/dL (ref 131–425)

## 2022-10-05 ENCOUNTER — Other Ambulatory Visit: Payer: Self-pay

## 2022-10-05 ENCOUNTER — Encounter (HOSPITAL_COMMUNITY): Payer: Self-pay | Admitting: Internal Medicine

## 2022-10-05 ENCOUNTER — Telehealth: Payer: Self-pay | Admitting: Pharmacy Technician

## 2022-10-05 ENCOUNTER — Telehealth: Payer: Self-pay | Admitting: Internal Medicine

## 2022-10-05 DIAGNOSIS — E611 Iron deficiency: Secondary | ICD-10-CM

## 2022-10-05 NOTE — Telephone Encounter (Addendum)
Auth Submission: NO AUTH NEEDED Site of care: [facility] Payer: [place] MEDICA[employer] [place] [place] Medication & CPT/J Code(s) submitted: Feraheme (ferumoxytol) [id number] Route of submission (phone, fax, portal):  Phone # Fax # Auth type: Buy/Bill Units/visits requested: 2 Reference number:  Approval from: 10/31/22 to 03/03/23

## 2022-10-05 NOTE — Telephone Encounter (Signed)
Infusion center called in on patient behalf.  Insurance will not cover infusion. Can set patient up for free drug assistance program.  Needs new referral stating Ferahem Assistance  510mg  x 2 doses

## 2022-10-08 ENCOUNTER — Other Ambulatory Visit: Payer: Self-pay

## 2022-10-08 MED ORDER — FERUMOXYTOL INJECTION 510 MG/17 ML: 510.0000 mg | INTRAVENOUS | 0 refills | Status: DC

## 2022-10-14 ENCOUNTER — Encounter (HOSPITAL_COMMUNITY): Payer: Self-pay | Admitting: Internal Medicine

## 2022-10-31 ENCOUNTER — Encounter (HOSPITAL_COMMUNITY): Payer: Self-pay | Admitting: Internal Medicine

## 2022-11-01 ENCOUNTER — Encounter (HOSPITAL_COMMUNITY): Payer: Self-pay | Admitting: Internal Medicine

## 2022-11-02 ENCOUNTER — Telehealth: Payer: Self-pay | Admitting: Internal Medicine

## 2022-11-02 ENCOUNTER — Ambulatory Visit (HOSPITAL_COMMUNITY)
Admission: RE | Admit: 2022-11-02 | Discharge: 2022-11-02 | Disposition: A | Discharge status: Home / Self Care | Payer: Medicaid Other | Source: Ambulatory Visit | Attending: Internal Medicine | Admitting: Internal Medicine

## 2022-11-02 VITALS — BP 119/68 | HR 77 | Temp 98.3°F | Resp 16

## 2022-11-02 DIAGNOSIS — E611 Iron deficiency: Secondary | ICD-10-CM | POA: Diagnosis present

## 2022-11-02 MED ORDER — ACETAMINOPHEN 325 MG PO TABS
650.0000 mg | ORAL_TABLET | Freq: Once | ORAL | Status: AC
  Administered 2022-11-02: 650 mg via ORAL
  Filled 2022-11-02: qty 2

## 2022-11-02 MED ORDER — SODIUM CHLORIDE 0.9 % IV SOLN
510.0000 mg | Freq: Once | INTRAVENOUS | Status: AC
  Administered 2022-11-02: 510 mg via INTRAVENOUS
  Filled 2022-11-02: qty 510

## 2022-11-02 MED ORDER — DIPHENHYDRAMINE HCL 25 MG PO CAPS
25.0000 mg | ORAL_CAPSULE | Freq: Once | ORAL | Status: AC
  Administered 2022-11-02: 25 mg via ORAL
  Filled 2022-11-02: qty 1

## 2022-11-02 NOTE — Progress Notes (Signed)
Diagnosis: Iron Deficiency Anemia  Provider:  Christel Mormon MD  Procedure: IV Infusion  IV Type: Peripheral, IV Location: L Antecubital  Feraheme (Ferumoxytol), Dose: 510 mg  Infusion Start Time: 1506  Infusion Stop Time: 1522  Post Infusion IV Care: Observation period completed and Peripheral IV Discontinued  Discharge: Condition: Stable, Destination: Home . AVS Provided  Performed by:  Wyvonne Lenz, RN

## 2022-11-02 NOTE — Telephone Encounter (Signed)
Pt stated she gets iron infusions and also takes iron pills. Was wondering if she needs to continue doing both.

## 2022-11-02 NOTE — Addendum Note (Signed)
Encounter addended by: Wyvonne Lenz, RN on: 11/02/2022 3:58 PM  Actions taken: Therapy plan modified

## 2022-11-06 NOTE — Telephone Encounter (Signed)
LVM

## 2022-11-06 NOTE — Telephone Encounter (Signed)
Patient advised.

## 2022-11-09 ENCOUNTER — Encounter (HOSPITAL_COMMUNITY)
Admission: RE | Admit: 2022-11-09 | Discharge: 2022-11-09 | Disposition: A | Discharge status: Home / Self Care | Payer: Medicaid Other | Source: Ambulatory Visit | Attending: Internal Medicine | Admitting: Internal Medicine

## 2022-11-09 VITALS — BP 108/71 | HR 82 | Temp 98.6°F | Resp 20

## 2022-11-09 DIAGNOSIS — E611 Iron deficiency: Secondary | ICD-10-CM

## 2022-11-09 DIAGNOSIS — D509 Iron deficiency anemia, unspecified: Secondary | ICD-10-CM | POA: Diagnosis present

## 2022-11-09 MED ORDER — ACETAMINOPHEN 325 MG PO TABS
650.0000 mg | ORAL_TABLET | Freq: Once | ORAL | Status: AC
  Administered 2022-11-09: 650 mg via ORAL

## 2022-11-09 MED ORDER — DIPHENHYDRAMINE HCL 25 MG PO CAPS: 25.0000 mg | ORAL_CAPSULE | Freq: Once | ORAL | Status: DC

## 2022-11-09 MED ORDER — SODIUM CHLORIDE 0.9 % IV SOLN
510.0000 mg | Freq: Once | INTRAVENOUS | Status: AC
  Administered 2022-11-09: 510 mg via INTRAVENOUS
  Filled 2022-11-09: qty 510

## 2022-11-09 NOTE — Progress Notes (Signed)
Diagnosis: Iron Deficiency Anemia  Provider:  Christel Mormon MD  Procedure: IV Infusion  IV Type: Peripheral, IV Location: R Antecubital  Feraheme (Ferumoxytol), Dose: 510 mg  Infusion Start Time: 1442  Infusion Stop Time: 1457  Post Infusion IV Care: Peripheral IV discontinued.  Discharge: Condition: Stable, Destination: Home . AVS declined.  Performed by:  Wyvonne Lenz, RN

## 2022-11-29 ENCOUNTER — Ambulatory Visit
Admission: RE | Admit: 2022-11-29 | Discharge: 2022-11-29 | Disposition: A | Discharge status: Home / Self Care | Payer: Medicaid Other | Source: Ambulatory Visit | Attending: Nurse Practitioner | Admitting: Nurse Practitioner

## 2022-11-29 VITALS — BP 147/84 | HR 78 | Temp 98.3°F | Resp 20

## 2022-11-29 DIAGNOSIS — N644 Mastodynia: Secondary | ICD-10-CM

## 2022-11-29 LAB — POCT URINE PREGNANCY: Preg Test, Ur: NEGATIVE

## 2022-11-29 NOTE — ED Provider Notes (Signed)
[facility]    CSN: [id number] Arrival date & time: 11/29/22  1833      History   Chief Complaint Chief Complaint  Patient presents with   Breast Problem    Burning, itchy - Entered by patient    HPI Kristen Kline is a 37 y.o. female.   Patient presents today with left breast burning and itching has been ongoing for the past day.  She reports it is around her left nipple.  She denies any skin changes, redness, swelling to the breast or nipple.  No recent trauma to the breast.  No nipple discharge or lumps in her breasts.  She tried using hydrogen peroxide which did not really seem to help.  She denies history of similar.  Reports irregular menses; last menstrual cycle 2 months ago.      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   Sepsis due to urinary tract infection (HCC) 06/11/2022   UTI (lower urinary tract infection) 05/30/2015    Patient Active Problem List   Diagnosis Date Noted   Thrombocytosis 09/26/2022   Fatigue 09/03/2022   Iron deficiency 07/06/2022   Vitamin D insufficiency 07/06/2022   Encounter for general adult medical examination with abnormal findings 06/18/2022   History of pyelonephritis 06/11/2022   Hyperlipidemia 12/02/2020   Microcytic anemia 10/10/2019   Morbid obesity (HCC)    Morbid obesity with BMI of 50.0-59.9, adult (HCC) 05/30/2015    Past Surgical History:  Procedure Laterality Date   CESAREAN SECTION N/A    [employer] 11/17/2019   CHOLECYSTECTOMY     WISDOM TOOTH EXTRACTION      OB History     Gravida  2   Para  2   Term  2   Preterm      AB      Living  2      SAB      IAB      Ectopic      Multiple       Live Births               Home Medications    Prior to Admission medications   Medication Sig Start Date End Date Taking? Authorizing Provider  acetaminophen (TYLENOL) 500 MG tablet Take 1,000 mg by mouth 3 (three) times daily as needed for moderate pain.    [provider]  albuterol (VENTOLIN HFA) 108 (90 Base) MCG/ACT inhaler Inhale 2 puffs into the lungs every 6 (six) hours as needed for wheezing or shortness of breath. 04/26/22   Leath-Warren, Sadie Haber, NP  ferumoxytol (FERAHEME) 510 MG/17ML SOLN injection Inject 17 mLs (510 mg total) into the vein every 30 (thirty) days for 2 doses. 10/08/22 11/08/22  Billie Lade, MD  ibuprofen (ADVIL) 200 MG tablet Take 400-600 mg by mouth 3 (three) times daily as needed for moderate pain.    [provider]  Iron, Ferrous Sulfate, 325 (65 Fe) MG TABS Take 325 mg by mouth daily. 09/03/22   Del Nigel Berthold, FNP    Family History Family History  Problem Relation Age of Onset   Heart failure Mother    Diabetes Mother    Hypertension  Father    Stroke Paternal Grandfather    Stroke Paternal Grandmother     Social History Social History   Tobacco Use   Smoking status: Former   Smokeless tobacco: Never  Building services engineer Use: Never used  Substance Use Topics   Alcohol use: No   Drug use: No     Allergies   Patient has no known allergies.   Review of Systems Review of Systems Per HPI  Physical Exam Triage Vital Signs ED Triage Vitals [11/29/22 1857]  Enc Vitals Group     BP (!) 147/84     Pulse Rate 78     Resp 20     Temp 98.3 F (36.8 C)     Temp Source Oral     SpO2 96 %     Weight      Height      Head Circumference      Peak Flow      Pain Score 6     Pain Loc      Pain Edu?      Excl. in GC?    No data found.  Updated Vital Signs BP (!) 147/84 (BP Location: Right Arm)   Pulse 78   Temp 98.3 F (36.8 C) (Oral)   Resp 20   LMP 09/27/2022 (Approximate)   SpO2 96%    Visual Acuity Right Eye Distance:   Left Eye Distance:   Bilateral Distance:    Right Eye Near:   Left Eye Near:    Bilateral Near:     Physical Exam Vitals and nursing note reviewed. Chaperone present: patient declines chaperone today.  Constitutional:      General: She is not in acute distress.    Appearance: Normal appearance. She is not toxic-appearing.  HENT:     Mouth/Throat:     Mouth: Mucous membranes are moist.     Pharynx: Oropharynx is clear.  Pulmonary:     Effort: Pulmonary effort is normal. No respiratory distress.  Chest:  Breasts:    Left: Tenderness present. No inverted nipple, nipple discharge or skin change.     Comments: Burning sensation with palpation of the left areola.  No skin abnormalities appreciated.  No mass around the nipple, nipple discharge. Skin:    General: Skin is warm and dry.     Capillary Refill: Capillary refill takes less than 2 seconds.     Coloration: Skin is not jaundiced or pale.     Findings: No erythema.  Neurological:     Mental Status: She is alert and oriented to person, place, and time.  Psychiatric:        Behavior: Behavior is cooperative.      UC Treatments / Results  Labs (all labs ordered are listed, but only abnormal results are displayed) Labs Reviewed  POCT URINE PREGNANCY    EKG   Radiology No results found.  Procedures Procedures (including critical care time)  Medications Ordered in UC Medications - No data to display  Initial Impression / Assessment and Plan / UC Course  I have reviewed the triage vital signs and the nursing notes.  Pertinent labs & imaging results that were available during my care of the patient were reviewed by me and considered in my medical decision making (see chart for details).   Patient is well-appearing, normotensive, afebrile, not tachycardic, not tachypneic, oxygenating well on room air.    1. Breast pain, left Urine pregnancy test today is negative Unclear  etiology; sounds a bit like neuropathic pain Recommended Tylenol and warm compresses to help with pain Follow-up with OB/GYN with persistent/worsening symptoms despite treatment  The patient was given the opportunity to ask questions.  All questions answered to their satisfaction.  The patient is in agreement to this plan.    Final Clinical Impressions(s) / UC Diagnoses   Final diagnoses:  Breast pain, left     Discharge Instructions      Urine pregnancy test is negative today.  I do not see any sign of infection or cause of the pain you are having in your breast.  You can use warm compresses or Tylenol to help with the pain.  Follow up with OB/GYN if symptoms do not improve.      ED Prescriptions   None    PDMP not reviewed this encounter.   Valentino Nose, NP 11/29/22 2011

## 2022-11-29 NOTE — Discharge Instructions (Addendum)
Urine pregnancy test is negative today.  I do not see any sign of infection or cause of the pain you are having in your breast.  You can use warm compresses or Tylenol to help with the pain.  Follow up with OB/GYN if symptoms do not improve.

## 2022-11-29 NOTE — ED Triage Notes (Signed)
Pt reports she is having some burning and itching in/on her left breast around her areola x 1 day. Used peroxide for relief. Pain worsens when she moves a certain way.

## 2022-12-26 ENCOUNTER — Other Ambulatory Visit: Payer: Self-pay

## 2023-01-11 ENCOUNTER — Ambulatory Visit
Admission: EM | Admit: 2023-01-11 | Discharge: 2023-01-11 | Disposition: A | Discharge status: Home / Self Care | Payer: Medicaid Other | Attending: Nurse Practitioner | Admitting: Nurse Practitioner

## 2023-01-11 DIAGNOSIS — R300 Dysuria: Secondary | ICD-10-CM | POA: Diagnosis not present

## 2023-01-11 DIAGNOSIS — N898 Other specified noninflammatory disorders of vagina: Secondary | ICD-10-CM | POA: Insufficient documentation

## 2023-01-11 LAB — POCT URINALYSIS DIP (MANUAL ENTRY)
Bilirubin, UA: NEGATIVE
Blood, UA: NEGATIVE
Glucose, UA: NEGATIVE mg/dL
Ketones, POC UA: NEGATIVE mg/dL
Leukocytes, UA: NEGATIVE
Nitrite, UA: NEGATIVE
Protein Ur, POC: NEGATIVE mg/dL
Spec Grav, UA: >=1.03 — AB (ref 1.010–1.025)
Urobilinogen, UA: 0.2 E.U./dL
pH, UA: 5.5 (ref 5.0–8.0)

## 2023-01-11 NOTE — ED Triage Notes (Signed)
Pt c/o Uti sx's urinary frequency, burning with urination, odor, x 2 days

## 2023-01-11 NOTE — Discharge Instructions (Addendum)
The urinalysis does not indicate an obvious urinary tract infection.  A urine culture has been ordered along with a cytology swab.  If the pending test results are abnormal, you will be contacted.  You will also be able to see the results via your MyChart account. Take over-the-counter Tylenol or ibuprofen as needed for pain, fever, general discomfort. Make sure you are drinking plenty of fluids.  Recommend drinking at least 8-10 8 ounce glasses of water daily. Make sure you are urinating every 2 hours. If you are sexually active, make sure you are voiding at least 15 to 20 minutes after sexual intercourse. As discussed, if you develop worsening symptoms to include fever, chills, low back pain, or pain around your kidneys, please go to the emergency department immediately for further evaluation. Follow-up as needed.

## 2023-01-11 NOTE — ED Provider Notes (Signed)
[facility]    CSN: [id number] Arrival date & time: 01/11/23  1024      History   Chief Complaint No chief complaint on file.   HPI Kristen Kline is a 37 y.o. female.   The history is provided by the patient.   The patient presents with a 2-day history of burning with urination, urinary frequency, and vaginal odor.  Patient denies fever, chills, chest pain, abdominal pain, nausea, vomiting, diarrhea, hematuria, decreased urine stream, flank pain, low back pain, vaginal itching, or vaginal discharge.  Patient reports that she does have a history of a kidney infection, for which she was hospitalized.  Patient states that she has been trying to drink more water since her symptoms started.  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   Sepsis due to urinary tract infection (HCC) 06/11/2022   UTI (lower urinary tract infection) 05/30/2015    Patient Active Problem List   Diagnosis Date Noted   Thrombocytosis 09/26/2022   Fatigue 09/03/2022   Iron deficiency 07/06/2022   Vitamin D insufficiency 07/06/2022   Encounter for general adult medical examination with abnormal findings 06/18/2022   History of pyelonephritis 06/11/2022   Hyperlipidemia 12/02/2020   Microcytic anemia 10/10/2019   Morbid obesity (HCC)    Morbid obesity with BMI of 50.0-59.9, adult (HCC) 05/30/2015    Past Surgical History:  Procedure Laterality Date   CESAREAN SECTION N/A    [employer] 11/17/2019   CHOLECYSTECTOMY     WISDOM TOOTH EXTRACTION      OB History     Gravida  2   Para  2   Term  2   Preterm      AB      Living  2      SAB      IAB      Ectopic      Multiple      Live  Births               Home Medications    Prior to Admission medications   Medication Sig Start Date End Date Taking? Authorizing Provider  acetaminophen (TYLENOL) 500 MG tablet Take 1,000 mg by mouth 3 (three) times daily as needed for moderate pain.    [provider]  albuterol (VENTOLIN HFA) 108 (90 Base) MCG/ACT inhaler Inhale 2 puffs into the lungs every 6 (six) hours as needed for wheezing or shortness of breath. 04/26/22   -Warren, Sadie Haber, NP  ferumoxytol (FERAHEME) 510 MG/17ML SOLN injection Inject 17 mLs (510 mg total) into the vein every 30 (thirty) days for 2 doses. 10/08/22 11/08/22  Billie Lade, MD  ibuprofen (ADVIL) 200 MG tablet Take 400-600 mg by mouth 3 (three) times daily as needed for moderate pain.    [provider]  Iron, Ferrous Sulfate, 325 (65 Fe) MG TABS Take 325 mg by mouth daily. 09/03/22   Del Nigel Berthold, FNP    Family History Family History  Problem Relation Age of Onset   Heart failure Mother    Diabetes Mother    Hypertension Father    Stroke Paternal Grandfather    Stroke Paternal Grandmother  Social History Social History   Tobacco Use   Smoking status: Former   Smokeless tobacco: Never  Advertising account planner   Vaping status: Never Used  Substance Use Topics   Alcohol use: No   Drug use: No     Allergies   Patient has no known allergies.   Review of Systems Review of Systems Per HPI  Physical Exam Triage Vital Signs ED Triage Vitals  Encounter Vitals Group     BP 01/11/23 1051 116/69     Systolic BP Percentile --      Diastolic BP Percentile --      Pulse Rate 01/11/23 1051 72     Resp 01/11/23 1051 13     Temp 01/11/23 1051 98.1 F (36.7 C)     Temp Source 01/11/23 1051 Oral     SpO2 01/11/23 1051 95 %     Weight --      Height --      Head Circumference --      Peak Flow --      Pain Score 01/11/23 1053 6     Pain Loc --      Pain Education --      Exclude from Growth Chart --     No data found.  Updated Vital Signs BP 116/69 (BP Location: Right Arm)   Pulse 72   Temp 98.1 F (36.7 C) (Oral)   Resp 13   LMP 12/22/2022 (Exact Date)   SpO2 95%   Visual Acuity Right Eye Distance:   Left Eye Distance:   Bilateral Distance:    Right Eye Near:   Left Eye Near:    Bilateral Near:     Physical Exam Vitals and nursing note reviewed.  Constitutional:      General: She is not in acute distress.    Appearance: Normal appearance.  HENT:     Head: Normocephalic.  Eyes:     Extraocular Movements: Extraocular movements intact.     Pupils: Pupils are equal, round, and reactive to light.  Cardiovascular:     Rate and Rhythm: Normal rate and regular rhythm.     Pulses: Normal pulses.     Heart sounds: Normal heart sounds.  Pulmonary:     Effort: Pulmonary effort is normal. No respiratory distress.     Breath sounds: Normal breath sounds. No stridor. No wheezing, rhonchi or rales.  Abdominal:     General: Bowel sounds are normal.     Palpations: Abdomen is soft.     Tenderness: There is no abdominal tenderness. There is no right CVA tenderness or left CVA tenderness.  Musculoskeletal:     Cervical back: Normal range of motion.  Lymphadenopathy:     Cervical: No cervical adenopathy.  Skin:    General: Skin is warm and dry.  Neurological:     General: No focal deficit present.     Mental Status: She is alert and oriented to person, place, and time.  Psychiatric:        Mood and Affect: Mood normal.        Behavior: Behavior normal.      UC Treatments / Results  Labs (all labs ordered are listed, but only abnormal results are displayed) Labs Reviewed  POCT URINALYSIS DIP (MANUAL ENTRY) - Abnormal; Notable for the following components:      Result Value   Clarity, UA hazy (*)    Spec Grav, UA >=1.030 (*)    All other components within normal limits  URINE CULTURE  CERVICOVAGINAL ANCILLARY ONLY    EKG   Radiology No results  found.  Procedures Procedures (including critical care time)  Medications Ordered in UC Medications - No data to display  Initial Impression / Assessment and Plan / UC Course  I have reviewed the triage vital signs and the nursing notes.  Pertinent labs & imaging results that were available during my care of the patient were reviewed by me and considered in my medical decision making (see chart for details).  The patient is well-appearing, she is in no acute distress, vital signs are stable.  Urinalysis is not suggestive of a urinary tract infection, urine culture is pending.  Also given the symptom of vaginal odor, cytology swab was collected.  Supportive care recommendations were provided and discussed with the patient to include increasing her fluid intake, voiding every 2 hours, and over-the-counter analgesics for pain or discomfort.  Patient was advised results will be available via MyChart.  Patient was given strict ER follow-up precautions.  Patient is in agreement with this plan of care and verbalizes understanding.  All questions were answered.  Patient stable for discharge.  Final Clinical Impressions(s) / UC Diagnoses   Final diagnoses:  Dysuria  Vaginal odor     Discharge Instructions      The urinalysis does not indicate an obvious urinary tract infection.  A urine culture has been ordered along with a cytology swab.  If the pending test results are abnormal, you will be contacted.  You will also be able to see the results via your MyChart account. Take over-the-counter Tylenol or ibuprofen as needed for pain, fever, general discomfort. Make sure you are drinking plenty of fluids.  Recommend drinking at least 8-10 8 ounce glasses of water daily. Make sure you are urinating every 2 hours. If you are sexually active, make sure you are voiding at least 15 to 20 minutes after sexual intercourse. As discussed, if you develop worsening symptoms to include fever, chills, low  back pain, or pain around your kidneys, please go to the emergency department immediately for further evaluation. Follow-up as needed.     ED Prescriptions   None    PDMP not reviewed this encounter.   Abran Cantor, NP 01/11/23 1118

## 2023-01-15 ENCOUNTER — Ambulatory Visit: Payer: Medicaid Other | Admitting: Internal Medicine

## 2023-01-15 ENCOUNTER — Encounter: Payer: Self-pay | Admitting: Internal Medicine

## 2023-01-15 VITALS — BP 120/78 | HR 89 | Ht 66.0 in | Wt 324.4 lb

## 2023-01-15 DIAGNOSIS — E611 Iron deficiency: Secondary | ICD-10-CM

## 2023-01-15 DIAGNOSIS — R300 Dysuria: Secondary | ICD-10-CM

## 2023-01-15 DIAGNOSIS — R5383 Other fatigue: Secondary | ICD-10-CM

## 2023-01-15 DIAGNOSIS — B9689 Other specified bacterial agents as the cause of diseases classified elsewhere: Secondary | ICD-10-CM | POA: Insufficient documentation

## 2023-01-15 DIAGNOSIS — N760 Acute vaginitis: Secondary | ICD-10-CM | POA: Diagnosis not present

## 2023-01-15 LAB — POCT URINALYSIS DIP (CLINITEK)
Bilirubin, UA: NEGATIVE
Blood, UA: NEGATIVE
Glucose, UA: NEGATIVE mg/dL
Ketones, POC UA: NEGATIVE mg/dL
Nitrite, UA: NEGATIVE
POC PROTEIN,UA: NEGATIVE
Spec Grav, UA: >=1.03 — AB (ref 1.010–1.025)
Urobilinogen, UA: 0.2 E.U./dL
pH, UA: 5.5 (ref 5.0–8.0)

## 2023-01-15 LAB — POCT URINE PREGNANCY: Preg Test, Ur: NEGATIVE

## 2023-01-15 MED ORDER — METRONIDAZOLE 500 MG PO TABS
500.0000 mg | ORAL_TABLET | Freq: Two times a day (BID) | ORAL | 0 refills | Status: DC
Start: 2023-01-15 — End: 2023-04-08

## 2023-01-15 NOTE — Assessment & Plan Note (Signed)
This is an acute on chronic issue.  She states that her symptoms have been worse over the last week and she feels similarly to how she did in the spring prior to receiving iron infusions.  Urine pregnancy test is negative.  UA is not consistent with infection.  She is being treated for BV, however I do not believe this is the underlying etiology of her symptoms. -Repeat labs ordered today.  She may need additional iron infusions. -Further clinical course pending results

## 2023-01-15 NOTE — Assessment & Plan Note (Signed)
Vaginal swab completed 8/2 positive for BV. -Metronidazole 500 mg twice daily x 7 days prescribed today

## 2023-01-15 NOTE — Addendum Note (Signed)
Addended by: Telford Nab on: 01/15/2023 11:39 AM   Modules accepted: Orders

## 2023-01-15 NOTE — Progress Notes (Signed)
Acute Office Visit  Subjective:     Patient ID: Kristen Kline, female    DOB: Jul 18, 1985, 37 y.o.   MRN: [id number]  Chief Complaint  Patient presents with   Fatigue    Started about 3-4 days ago. Feeling weak and tired.   Kristen Kline presents today for an acute visit endorsing a 3-4-day history of fatigue, weakness, and urinary symptoms.  She presented to the emergency department on Friday (8/2) endorsing similar symptoms.  UA at that time was not concerning for infection.  Her urine culture did not demonstrate any significant growth.  A vaginal swab later resulted with BV.  Today she states that overall her symptoms have mildly improved, however she is concerned that she has a kidney infection given her history of pyelonephritis.  Of note, she additionally has a history significant for iron deficiency, previously treated with iron infusions.  Her last infusion was completed on 5/31.  Review of Systems  Constitutional:  Positive for malaise/fatigue. Negative for chills and fever.  Genitourinary:  Positive for dysuria and frequency. Negative for flank pain, hematuria and urgency.  Neurological:  Positive for weakness.      Objective:    BP 120/78   Pulse 89   Ht 5\' 6"  (1.676 m)   Wt (!) 324 lb 6.4 oz (147.1 kg)   LMP 12/22/2022 (Exact Date)   SpO2 98%   BMI 52.36 kg/m   Physical Exam Vitals reviewed.  Constitutional:      General: She is not in acute distress.    Appearance: Normal appearance. She is obese. She is not toxic-appearing.  HENT:     Head: Normocephalic and atraumatic.     Right Ear: External ear normal.     Left Ear: External ear normal.     Nose: Nose normal. No congestion or rhinorrhea.     Mouth/Throat:     Mouth: Mucous membranes are moist.     Pharynx: Oropharynx is clear. No oropharyngeal exudate or posterior oropharyngeal erythema.  Eyes:     General: No scleral icterus.    Extraocular Movements: Extraocular movements intact.      Conjunctiva/sclera: Conjunctivae normal.     Pupils: Pupils are equal, round, and reactive to light.  Cardiovascular:     Rate and Rhythm: Normal rate and regular rhythm.     Pulses: Normal pulses.     Heart sounds: Normal heart sounds. No murmur heard.    No friction rub. No gallop.  Pulmonary:     Effort: Pulmonary effort is normal.     Breath sounds: Normal breath sounds. No wheezing, rhonchi or rales.  Abdominal:     General: Abdomen is flat. Bowel sounds are normal. There is no distension.     Palpations: Abdomen is soft.     Tenderness: There is no abdominal tenderness. There is no right CVA tenderness or left CVA tenderness.  Musculoskeletal:        General: No swelling. Normal range of motion.     Cervical back: Normal range of motion.     Right lower leg: No edema.     Left lower leg: No edema.  Lymphadenopathy:     Cervical: No cervical adenopathy.  Skin:    General: Skin is warm and dry.     Capillary Refill: Capillary refill takes less than 2 seconds.     Coloration: Skin is not jaundiced.  Neurological:     General: No focal deficit present.     Mental Status:  She is alert and oriented to person, place, and time.  Psychiatric:        Mood and Affect: Mood normal.        Behavior: Behavior normal.       Assessment & Plan:   Problem List Items Addressed This Visit       Bacterial vaginitis    Vaginal swab completed 8/2 positive for BV. -Metronidazole 500 mg twice daily x 7 days prescribed today      Fatigue    This is an acute on chronic issue.  She states that her symptoms have been worse over the last week and she feels similarly to how she did in the spring prior to receiving iron infusions.  Urine pregnancy test is negative.  UA is not consistent with infection.  She is being treated for BV, however I do not believe this is the underlying etiology of her symptoms. -Repeat labs ordered today.  She may need additional iron infusions. -Further clinical  course pending results       Meds ordered this encounter  Medications   metroNIDAZOLE (FLAGYL) 500 MG tablet    Sig: Take 1 tablet (500 mg total) by mouth 2 (two) times daily.    Dispense:  14 tablet    Refill:  0    Return if symptoms worsen or fail to improve.  Billie Lade, MD

## 2023-01-15 NOTE — Patient Instructions (Signed)
It was a pleasure to see you today.  Thank you for giving [place] the opportunity to be involved in your care.  Below is a brief recap of your visit and next steps.  We will plan to see you again in October.  Summary Urine test is not consistent with urinary tract infection I have prescribed metronidazole for treatment of BV Repeat labs ordered due to symptoms of fatigue / weakness Follow up in October for routine care

## 2023-02-20 ENCOUNTER — Ambulatory Visit: Payer: Medicaid Other | Admitting: Obstetrics & Gynecology

## 2023-03-28 ENCOUNTER — Ambulatory Visit: Payer: Self-pay | Admitting: Internal Medicine

## 2023-04-01 ENCOUNTER — Encounter: Payer: Self-pay | Admitting: Internal Medicine

## 2023-04-08 ENCOUNTER — Ambulatory Visit
Admission: RE | Admit: 2023-04-08 | Discharge: 2023-04-08 | Disposition: A | Discharge status: Home / Self Care | Payer: Medicaid Other | Source: Ambulatory Visit | Attending: Nurse Practitioner | Admitting: Nurse Practitioner

## 2023-04-08 VITALS — BP 121/72 | HR 95 | Temp 97.8°F | Resp 17

## 2023-04-08 DIAGNOSIS — R350 Frequency of micturition: Secondary | ICD-10-CM | POA: Diagnosis present

## 2023-04-08 LAB — POCT URINALYSIS DIP (MANUAL ENTRY)
Bilirubin, UA: NEGATIVE
Blood, UA: NEGATIVE
Glucose, UA: NEGATIVE mg/dL
Ketones, POC UA: NEGATIVE mg/dL
Nitrite, UA: NEGATIVE
Protein Ur, POC: NEGATIVE mg/dL
Spec Grav, UA: 1.025 (ref 1.010–1.025)
Urobilinogen, UA: 0.2 U/dL
pH, UA: 6 (ref 5.0–8.0)

## 2023-04-08 NOTE — ED Provider Notes (Signed)
[facility]    CSN: [id number] Arrival date & time: 04/08/23  1535      History   Chief Complaint Chief Complaint  Patient presents with   Chills    Cold chills & Lower back pain & pain urinating would like my kidneys checked - Entered by patient    HPI Kristen Kline is a 37 y.o. female.   Patient presents today with 1 day history of increased urinary frequency and lower abdominal cramping and low back pain across the bottom that she attributes to her menstrual cycle starting.  She denies dysuria, foul urinary odor, hematuria, flank pain, body aches, chills, vomiting, and nausea.  No vaginal discharge.  Reports last night she did have some chills and took a Tylenol which seemed to help.  She is concerned for sepsis and pyelonephritis that she has a history of the same about 1 year ago and wants to make sure she does not have that.  Reports and symptoms began, she has been drinking more water which has seemed to help a little bit.  No concern for STI today.    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   Sepsis due to urinary tract infection (HCC) 06/11/2022   UTI (lower urinary tract infection) 05/30/2015    Patient Active Problem List   Diagnosis Date Noted   Bacterial vaginitis 01/15/2023   Thrombocytosis 09/26/2022   Fatigue 09/03/2022   Iron deficiency 07/06/2022   Vitamin D insufficiency 07/06/2022   Encounter for general adult medical examination with abnormal findings 06/18/2022   History of pyelonephritis 06/11/2022   Hyperlipidemia 12/02/2020   Microcytic anemia 10/10/2019   Morbid obesity (HCC)    Morbid obesity with BMI of 50.0-59.9, adult (HCC) 05/30/2015     Past Surgical History:  Procedure Laterality Date   CESAREAN SECTION N/A    [employer] 11/17/2019   CHOLECYSTECTOMY     WISDOM TOOTH EXTRACTION      OB History     Gravida  2   Para  2   Term  2   Preterm      AB      Living  2      SAB      IAB      Ectopic      Multiple      Live Births               Home Medications    Prior to Admission medications   Medication Sig Start Date End Date Taking? Authorizing Provider  acetaminophen (TYLENOL) 500 MG tablet Take 1,000 mg by mouth 3 (three) times daily as needed for moderate pain.    [provider]  albuterol (VENTOLIN HFA) 108 (90 Base) MCG/ACT inhaler Inhale 2 puffs into the lungs every 6 (six) hours as needed for wheezing or shortness of breath. 04/26/22   Leath-Warren, Sadie Haber, NP  ferumoxytol (FERAHEME) 510 MG/17ML SOLN injection Inject 17 mLs (510 mg total) into the vein every 30 (thirty) days for 2 doses. 10/08/22 01/15/23  Billie Lade, MD  ibuprofen (ADVIL) 200 MG tablet Take 400-600 mg by mouth 3 (three) times daily as needed for moderate pain.  [provider]  Iron, Ferrous Sulfate, 325 (65 Fe) MG TABS Take 325 mg by mouth daily. 09/03/22   Del Nigel Berthold, FNP    Family History Family History  Problem Relation Age of Onset   Heart failure Mother    Diabetes Mother    Hypertension Father    Stroke Paternal Grandfather    Stroke Paternal Grandmother     Social History Social History   Tobacco Use   Smoking status: Former   Smokeless tobacco: Never  Vaping Use   Vaping status: Never Used  Substance Use Topics   Alcohol use: No   Drug use: No     Allergies   Patient has no known allergies.   Review of Systems Review of Systems Per HPI  Physical Exam Triage Vital Signs ED Triage Vitals  Encounter Vitals Group     BP 04/08/23 1539 121/72     Systolic BP Percentile --      Diastolic BP Percentile --      Pulse Rate 04/08/23 1539 95      Resp 04/08/23 1539 17     Temp 04/08/23 1539 97.8 F (36.6 C)     Temp Source 04/08/23 1539 Oral     SpO2 04/08/23 1539 95 %     Weight --      Height --      Head Circumference --      Peak Flow --      Pain Score 04/08/23 1545 6     Pain Loc --      Pain Education --      Exclude from Growth Chart --    No data found.  Updated Vital Signs BP 121/72 (BP Location: Right Arm)   Pulse 95   Temp 97.8 F (36.6 C) (Oral)   Resp 17   LMP 03/20/2023 (Within Days)   SpO2 95%   Visual Acuity Right Eye Distance:   Left Eye Distance:   Bilateral Distance:    Right Eye Near:   Left Eye Near:    Bilateral Near:     Physical Exam Vitals and nursing note reviewed.  Constitutional:      General: She is not in acute distress.    Appearance: She is obese. She is not toxic-appearing.  Pulmonary:     Effort: Pulmonary effort is normal. No respiratory distress.  Abdominal:     General: Abdomen is flat. Bowel sounds are normal. There is no distension.     Palpations: Abdomen is soft. There is no mass.     Tenderness: There is no abdominal tenderness. There is no right CVA tenderness, left CVA tenderness or guarding.  Skin:    General: Skin is warm and dry.     Coloration: Skin is not jaundiced or pale.     Findings: No erythema.  Neurological:     Mental Status: She is alert and oriented to person, place, and time.     Motor: No weakness.     Gait: Gait normal.  Psychiatric:        Behavior: Behavior is cooperative.      UC Treatments / Results  Labs (all labs ordered are listed, but only abnormal results are displayed) Labs Reviewed  POCT URINALYSIS DIP (MANUAL ENTRY) - Abnormal; Notable for the following components:      Result Value   Leukocytes, UA Trace (*)    All other components within normal limits  URINE CULTURE    EKG  Radiology No results found.  Procedures Procedures (including critical care time)  Medications Ordered in UC Medications - No data  to display  Initial Impression / Assessment and Plan / UC Course  I have reviewed the triage vital signs and the nursing notes.  Pertinent labs & imaging results that were available during my care of the patient were reviewed by me and considered in my medical decision making (see chart for details).   Patient is well-appearing, normotensive, afebrile, not tachycardic, not tachypneic, oxygenating well on room air.   1. Urinary frequency Urinalysis today with trace leukocyte Estrace Urine culture pending, will defer treatment until culture results Supportive care discussed with patient including increasing hydration with plenty of fluids Strict ER precautions discussed with patient  The patient was given the opportunity to ask questions.  All questions answered to their satisfaction.  The patient is in agreement to this plan.    Final Clinical Impressions(s) / UC Diagnoses   Final diagnoses:  Urinary frequency     Discharge Instructions      We will contact you later this week if we need to start antibiotic medicine for a UTI.  In the meantime, continue drinking plenty of water.       ED Prescriptions   None    PDMP not reviewed this encounter.   Valentino Nose, NP 04/08/23 [phone]

## 2023-04-08 NOTE — Discharge Instructions (Addendum)
We will contact you later this week if we need to start antibiotic medicine for a UTI.  In the meantime, continue drinking plenty of water.

## 2023-04-08 NOTE — ED Triage Notes (Signed)
Pt c/o cold chill, pain with urination, lower back pain x 3 days, pt states this time last year she was septic, and would like to make sure everything is okay.

## 2023-04-09 LAB — URINE CULTURE

## 2023-04-15 ENCOUNTER — Encounter: Payer: Self-pay | Admitting: Internal Medicine

## 2023-04-15 ENCOUNTER — Ambulatory Visit: Payer: Medicaid Other | Admitting: Internal Medicine

## 2023-04-15 VITALS — BP 122/85 | HR 92 | Temp 98.1°F | Resp 16 | Ht 66.0 in | Wt 326.0 lb

## 2023-04-15 DIAGNOSIS — J189 Pneumonia, unspecified organism: Secondary | ICD-10-CM | POA: Insufficient documentation

## 2023-04-15 DIAGNOSIS — E611 Iron deficiency: Secondary | ICD-10-CM

## 2023-04-15 MED ORDER — AZITHROMYCIN 250 MG PO TABS
ORAL_TABLET | ORAL | 0 refills | Status: AC
Start: 2023-04-15 — End: ?

## 2023-04-15 NOTE — Assessment & Plan Note (Signed)
She remains on oral iron supplementation.  Iron studies were updated in August and had improved, not consistent with iron deficiency.

## 2023-04-15 NOTE — Assessment & Plan Note (Signed)
She endorses a 3-day history of cough productive of cloudy white/yellow sputum as well as chills, nausea, and fatigue.  Rhonchi are present on exam today. -Z-Pak prescribed for treatment of community-acquired pneumonia

## 2023-04-15 NOTE — Progress Notes (Signed)
Established Patient Office Visit  Subjective   Patient ID: Kristen Kline, female    DOB: Dec 11, 1985  Age: 37 y.o. MRN: [id number]  Chief Complaint  Patient presents with   Cough    Coughing x 3 days, started out with chills and possible fever. Has been coughing up yellowish colored mucus    Follow-up   Kristen Kline returns to care today for routine follow-up.  She was last evaluated by me for an acute visit on 8/6.  Treated for bacterial vaginitis with metronidazole.  She endorsed worsening fatigue as well.  Repeat labs were ordered.  There have otherwise been no acute interval events.  Today Kristen Kline endorses a 3-day history of a cough productive of cloudy white/yellow sputum.  She additionally endorses nausea, fatigue, and chills but no fever.  A coworker recently had pneumonia.  She does not have any additional concerns to discuss today.  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   Sepsis due to urinary tract infection (HCC) 06/11/2022   UTI (lower urinary tract infection) 05/30/2015   Past Surgical History:  Procedure Laterality Date   CESAREAN SECTION N/A    [employer] 11/17/2019   CHOLECYSTECTOMY     WISDOM TOOTH EXTRACTION     Social History   Tobacco Use   Smoking status: Former   Smokeless tobacco: Never  Vaping Use   Vaping status: Never Used  Substance Use Topics   Alcohol use: No   Drug use: No   Family History  Problem Relation Age of Onset   Heart failure Mother    Diabetes Mother    Hypertension Father    Stroke Paternal Grandfather    Stroke Paternal Grandmother    No Known Allergies  Review of Systems  Constitutional:  Positive for chills. Negative for  fever.  HENT:  Positive for congestion.   Respiratory:  Positive for cough and sputum production. Negative for shortness of breath.      Objective:     BP 122/85   Pulse 92   Temp 98.1 F (36.7 C) (Oral)   Resp 16   Ht 5\' 6"  (1.676 m)   Wt (!) 326 lb (147.9 kg)   LMP 03/20/2023 (Within Days)   SpO2 96%   BMI 52.62 kg/m  BP Readings from Last 3 Encounters:  04/15/23 122/85  04/08/23 121/72  01/15/23 120/78   Physical Exam Vitals reviewed.  Constitutional:      General: She is not in acute distress.    Appearance: Normal appearance. She is obese. She is not toxic-appearing.  HENT:     Head: Normocephalic and atraumatic.     Right Ear: External ear normal.     Left Ear: External ear normal.     Nose: Nose normal. No congestion or rhinorrhea.     Mouth/Throat:     Mouth: Mucous membranes are moist.     Pharynx: Oropharynx is clear. No oropharyngeal exudate or posterior oropharyngeal erythema.  Eyes:     General: No scleral icterus.    Extraocular Movements: Extraocular movements intact.     Conjunctiva/sclera: Conjunctivae normal.     Pupils: Pupils are equal, round, and reactive to light.  Cardiovascular:  Rate and Rhythm: Normal rate and regular rhythm.     Pulses: Normal pulses.     Heart sounds: Normal heart sounds. No murmur heard.    No friction rub. No gallop.  Pulmonary:     Effort: Pulmonary effort is normal.     Breath sounds: Rhonchi present. No wheezing or rales.  Abdominal:     General: Abdomen is flat. Bowel sounds are normal. There is no distension.     Palpations: Abdomen is soft.     Tenderness: There is no abdominal tenderness. There is no right CVA tenderness or left CVA tenderness.  Musculoskeletal:        General: No swelling. Normal range of motion.     Cervical back: Normal range of motion.     Right lower leg: No edema.     Left lower leg: No edema.  Lymphadenopathy:     Cervical: No cervical adenopathy.  Skin:    General: Skin is  warm and dry.     Capillary Refill: Capillary refill takes less than 2 seconds.     Coloration: Skin is not jaundiced.  Neurological:     General: No focal deficit present.     Mental Status: She is alert and oriented to person, place, and time.  Psychiatric:        Mood and Affect: Mood normal.        Behavior: Behavior normal.   Last CBC Lab Results  Component Value Date   WBC 10.0 01/15/2023   HGB 12.5 01/15/2023   HCT 36.9 01/15/2023   MCV 84 01/15/2023   MCH 28.3 01/15/2023   RDW 14.1 01/15/2023   PLT 426 01/15/2023   Last metabolic panel Lab Results  Component Value Date   GLUCOSE 121 (H) 01/15/2023   NA 139 01/15/2023   K 4.4 01/15/2023   CL 103 01/15/2023   CO2 21 01/15/2023   BUN 8 01/15/2023   CREATININE 0.83 01/15/2023   EGFR 95 01/15/2023   CALCIUM 9.2 01/15/2023   PHOS 4.0 10/13/2019   PROT 6.9 01/15/2023   ALBUMIN 4.0 01/15/2023   LABGLOB 2.9 01/15/2023   AGRATIO 1.4 07/02/2022   BILITOT 0.2 01/15/2023   ALKPHOS 75 01/15/2023   AST 11 01/15/2023   ALT 12 01/15/2023   ANIONGAP 8 06/13/2022   Last lipids Lab Results  Component Value Date   CHOL 192 07/02/2022   HDL 43 07/02/2022   LDLCALC 132 (H) 07/02/2022   TRIG 94 07/02/2022   CHOLHDL 4.5 (H) 07/02/2022   Last hemoglobin A1c Lab Results  Component Value Date   HGBA1C 5.3 07/02/2022   Last thyroid functions Lab Results  Component Value Date   TSH 0.741 07/02/2022   Last vitamin D Lab Results  Component Value Date   VD25OH 27.1 (L) 07/02/2022   Last vitamin B12 and Folate Lab Results  Component Value Date   VITAMINB12 854 07/02/2022   FOLATE 14.3 07/02/2022     Assessment & Plan:   Problem List Items Addressed This Visit       Community acquired pneumonia - Primary    She endorses a 3-day history of cough productive of cloudy white/yellow sputum as well as chills, nausea, and fatigue.  Rhonchi are present on exam today. -Z-Pak prescribed for treatment of community-acquired  pneumonia      Iron deficiency    She remains on oral iron supplementation.  Iron studies were updated in August and had improved, not consistent with iron deficiency.  Return in about 6 months (around 10/13/2023).    Billie Lade, MD

## 2023-04-15 NOTE — Patient Instructions (Signed)
It was a pleasure to see you today.  Thank you for giving [place] the opportunity to be involved in your care.  Below is a brief recap of your visit and next steps.  We will plan to see you again in 6 months.   Summary Zpak prescribed for treatment of pneumonia Follow up in 6 months

## 2023-05-11 ENCOUNTER — Emergency Department (HOSPITAL_BASED_OUTPATIENT_CLINIC_OR_DEPARTMENT_OTHER)
Admission: EM | Admit: 2023-05-11 | Discharge: 2023-05-11 | Disposition: A | Discharge status: Home / Self Care | Payer: Medicaid Other

## 2023-05-11 ENCOUNTER — Other Ambulatory Visit: Payer: Self-pay

## 2023-05-11 ENCOUNTER — Emergency Department (HOSPITAL_BASED_OUTPATIENT_CLINIC_OR_DEPARTMENT_OTHER): Payer: Medicaid Other

## 2023-05-11 ENCOUNTER — Encounter (HOSPITAL_BASED_OUTPATIENT_CLINIC_OR_DEPARTMENT_OTHER): Payer: Self-pay | Admitting: Emergency Medicine

## 2023-05-11 DIAGNOSIS — Z20822 Contact with and (suspected) exposure to covid-19: Secondary | ICD-10-CM | POA: Insufficient documentation

## 2023-05-11 DIAGNOSIS — R059 Cough, unspecified: Secondary | ICD-10-CM | POA: Diagnosis present

## 2023-05-11 DIAGNOSIS — J101 Influenza due to other identified influenza virus with other respiratory manifestations: Secondary | ICD-10-CM

## 2023-05-11 DIAGNOSIS — J069 Acute upper respiratory infection, unspecified: Secondary | ICD-10-CM | POA: Diagnosis not present

## 2023-05-11 LAB — RESP PANEL BY RT-PCR (RSV, FLU A&B, COVID)  RVPGX2
Influenza A by PCR: POSITIVE — AB
Influenza B by PCR: NEGATIVE
Resp Syncytial Virus by PCR: NEGATIVE
SARS Coronavirus 2 by RT PCR: NEGATIVE

## 2023-05-11 MED ORDER — OSELTAMIVIR PHOSPHATE 75 MG PO CAPS: 75.0000 mg | ORAL_CAPSULE | Freq: Two times a day (BID) | ORAL | 0 refills | Status: AC

## 2023-05-11 MED ORDER — IBUPROFEN 800 MG PO TABS
800.0000 mg | ORAL_TABLET | Freq: Once | ORAL | Status: AC
  Administered 2023-05-11: 800 mg via ORAL
  Filled 2023-05-11: qty 1

## 2023-05-11 MED ORDER — OSELTAMIVIR PHOSPHATE 75 MG PO CAPS
75.0000 mg | ORAL_CAPSULE | Freq: Once | ORAL | Status: AC
  Administered 2023-05-11: 75 mg via ORAL
  Filled 2023-05-11: qty 1

## 2023-05-11 NOTE — ED Triage Notes (Signed)
Pt states feels like "hit by a train" with URI Sx want to make sure doesn't have Covid or flu.

## 2023-05-11 NOTE — ED Provider Notes (Signed)
 [place] [facility] Provider Note   CSN: [id number] Arrival date & time: 05/11/23  2056     History Chief Complaint  Patient presents with   URI    Kristen Kline is a 37 y.o. female.  Patient presents to the emergency department with 2 days of upper respiratory infection type symptoms.  She reports that she began to experience URI symptoms on Thanksgiving and felt that she was "hit by a train".  States that she is primarily concerned about fever, cough, and congestion.  No obvious or known sick contacts.  Has tried taking Tylenol and ibuprofen at home for fever and pain control.  Denies any nausea, vomiting, or diarrhea.  No abdominal pain.   URI Presenting symptoms: cough and fever        Home Medications Prior to Admission medications   Medication Sig Start Date End Date Taking? Authorizing Provider  oseltamivir (TAMIFLU) 75 MG capsule Take 1 capsule (75 mg total) by mouth 2 (two) times daily for 5 days. 05/11/23 05/16/23 Yes Smitty Knudsen, PA-C  acetaminophen (TYLENOL) 500 MG tablet Take 1,000 mg by mouth 3 (three) times daily as needed for moderate pain.    [provider]  azithromycin (ZITHROMAX Z-PAK) 250 MG tablet Take 2 tablets (500 mg) PO today, then 1 tablet (250 mg) PO daily x4 days. 04/15/23   Billie Lade, MD  Iron, Ferrous Sulfate, 325 (65 Fe) MG TABS Take 325 mg by mouth daily. 09/03/22   Del Nigel Berthold, FNP      Allergies    Patient has no known allergies.    Review of Systems   Review of Systems  Constitutional:  Positive for fever.  Respiratory:  Positive for cough.   All other systems reviewed and are negative.   Physical Exam Updated Vital Signs BP 122/76 (BP Location: Left Arm)   Pulse (!) 115   Temp 100 F (37.8 C) Comment: pt recently drinking cold beverage. Face is flushed.  Resp 20   Ht 5\' 5"  (1.651 m)   Wt 131.5 kg   SpO2 99%   BMI 48.26 kg/m  Physical Exam Vitals and  nursing note reviewed.  Constitutional:      General: She is not in acute distress.    Appearance: She is well-developed.  HENT:     Head: Normocephalic and atraumatic.  Eyes:     Conjunctiva/sclera: Conjunctivae normal.  Cardiovascular:     Rate and Rhythm: Regular rhythm. Tachycardia present.     Heart sounds: No murmur heard. Pulmonary:     Effort: Pulmonary effort is normal. No respiratory distress.     Breath sounds: Normal breath sounds.  Abdominal:     Palpations: Abdomen is soft.     Tenderness: There is no abdominal tenderness.  Musculoskeletal:        General: No swelling.     Cervical back: Neck supple.  Skin:    General: Skin is warm and dry.     Capillary Refill: Capillary refill takes less than 2 seconds.  Neurological:     Mental Status: She is alert.  Psychiatric:        Mood and Affect: Mood normal.     ED Results / Procedures / Treatments   Labs (all labs ordered are listed, but only abnormal results are displayed) Labs Reviewed  RESP PANEL BY RT-PCR (RSV, FLU A&B, COVID)  RVPGX2 - Abnormal; Notable for the following components:  Result Value   Influenza A by PCR POSITIVE (*)    All other components within normal limits    EKG None  Radiology DG Chest 2 View  Result Date: 05/11/2023 CLINICAL DATA:  Upper respiratory infection. EXAM: CHEST - 2 VIEW COMPARISON:  Chest x-ray 11/06/2019 FINDINGS: The heart size and mediastinal contours are within normal limits. Both lungs are clear. The visualized skeletal structures are unremarkable. IMPRESSION: No active cardiopulmonary disease. Electronically Signed   By: Darliss Cheney M.D.   On: 05/11/2023 22:05    Procedures Procedures   Medications Ordered in ED Medications  oseltamivir (TAMIFLU) capsule 75 mg (75 mg Oral Given 05/11/23 2253)  ibuprofen (ADVIL) tablet 800 mg (800 mg Oral Given 05/11/23 2253)    ED Course/ Medical Decision Making/ A&P                               Medical Decision  Making Amount and/or Complexity of Data Reviewed Radiology: ordered.  Risk Prescription drug management.   This patient presents to the ED for concern of URI.  Differential diagnosis includes COVID-19, influenza, pneumonia, bronchitis, strep pharyngitis   Lab Tests:  I Ordered, and personally interpreted labs.  The pertinent results include: Respiratory panel shows patient is positive for influenza A   Imaging Studies ordered:  I ordered imaging studies including chest x-ray I independently visualized and interpreted imaging which showed no acute cardiopulmonary process I agree with the radiologist interpretation   Medicines ordered and prescription drug management:  I ordered medication including ibuprofen, Tamiflu for fever, influenza Reevaluation of the patient after these medicines showed that the patient stayed the same I have reviewed the patients home medicines and have made adjustments as needed   Problem List / ED Course:  Patient presents to the emergency department with concerns of URI type symptoms.  Felt that she had noticeable shift in sudden onset of symptoms on Thursday 2 days ago.  She is here for evaluation to rule out possible influenza or COVID.  No known sick contacts.  Denies any chest pain, shortness of breath, syncope.  No abdominal pain or discomfort.  Respiratory panel obtained in triage for assessment of symptoms.  Appears likely viral in nature. On evaluation, patient does not have any abnormal lung sounds.  Hemodynamically stable although she is borderline tachycardic and has a low-grade fever at 100 degrees Fahrenheit.  Given that patient currently is positive for influenza A, suspect the patient's vitals are in relation to this.  Do not believe the patient is acutely septic. Discussed treatment with patient given her current symptoms.  Believe the patient would benefit from antiviral medication so a dose of Tamiflu was given here in the emergency  department as well as ibuprofen for borderline fever.  Remainder of prescription for Tamiflu sent to patient's pharmacy.  Advised patient return to the emergency department if she has any acute worsening or decline in symptoms.  Otherwise encourage close follow-up with primary care provider.  Patient discharged home in stable condition.  Final Clinical Impression(s) / ED Diagnoses Final diagnoses:  Influenza A    Rx / DC Orders ED Discharge Orders          Ordered    oseltamivir (TAMIFLU) 75 MG capsule  2 times daily        05/11/23 2247              Smitty Knudsen, PA-C 05/11/23 2339  Coral Spikes, DO 05/12/23 1455

## 2023-05-11 NOTE — Discharge Instructions (Signed)
You are seen in the emergency department today for concerns of upper respiratory infection.  Your labs show that you currently have influenza A.  This explains her current symptoms as well as the fever that you have been dealing with.  I would highly recommend that you start the Tamiflu which I have sent to your pharmacy.  You are given 1 initial dose here in the emergency department.  Please continue take Tylenol and ibuprofen for fever and pain.  If symptoms are acutely worsening, please return the emergency department.

## 2023-10-14 ENCOUNTER — Ambulatory Visit: Payer: Medicaid Other | Admitting: Internal Medicine

## 2023-10-28 ENCOUNTER — Encounter: Payer: Self-pay | Admitting: Internal Medicine

## 2023-12-17 ENCOUNTER — Encounter (HOSPITAL_BASED_OUTPATIENT_CLINIC_OR_DEPARTMENT_OTHER): Payer: Self-pay | Admitting: Emergency Medicine

## 2023-12-17 ENCOUNTER — Emergency Department (HOSPITAL_BASED_OUTPATIENT_CLINIC_OR_DEPARTMENT_OTHER)
Admission: EM | Admit: 2023-12-17 | Discharge: 2023-12-17 | Disposition: A | Discharge status: Home / Self Care | Attending: Emergency Medicine | Admitting: Emergency Medicine

## 2023-12-17 DIAGNOSIS — B349 Viral infection, unspecified: Secondary | ICD-10-CM | POA: Diagnosis not present

## 2023-12-17 DIAGNOSIS — R197 Diarrhea, unspecified: Secondary | ICD-10-CM | POA: Diagnosis not present

## 2023-12-17 DIAGNOSIS — M533 Sacrococcygeal disorders, not elsewhere classified: Secondary | ICD-10-CM | POA: Insufficient documentation

## 2023-12-17 DIAGNOSIS — R6883 Chills (without fever): Secondary | ICD-10-CM | POA: Diagnosis present

## 2023-12-17 LAB — URINALYSIS, ROUTINE W REFLEX MICROSCOPIC
Bilirubin Urine: NEGATIVE
Glucose, UA: NEGATIVE mg/dL
Hgb urine dipstick: NEGATIVE
Ketones, ur: NEGATIVE mg/dL
Leukocytes,Ua: NEGATIVE
Nitrite: NEGATIVE
Protein, ur: NEGATIVE mg/dL
Specific Gravity, Urine: 1.025 (ref 1.005–1.030)
pH: 5.5 (ref 5.0–8.0)

## 2023-12-17 LAB — RESP PANEL BY RT-PCR (RSV, FLU A&B, COVID)  RVPGX2
Influenza A by PCR: NEGATIVE
Influenza B by PCR: NEGATIVE
Resp Syncytial Virus by PCR: NEGATIVE
SARS Coronavirus 2 by RT PCR: NEGATIVE

## 2023-12-17 MED ORDER — DICYCLOMINE HCL 20 MG PO TABS: 20.0000 mg | ORAL_TABLET | Freq: Two times a day (BID) | ORAL | 0 refills | Status: AC

## 2023-12-17 MED ORDER — ONDANSETRON HCL 4 MG PO TABS: 4.0000 mg | ORAL_TABLET | Freq: Three times a day (TID) | ORAL | 0 refills | Status: AC | PRN

## 2023-12-17 MED ORDER — DICYCLOMINE HCL 10 MG PO CAPS
10.0000 mg | ORAL_CAPSULE | Freq: Once | ORAL | Status: AC
  Administered 2023-12-17: 10 mg via ORAL
  Filled 2023-12-17: qty 1

## 2023-12-17 MED ORDER — ONDANSETRON 4 MG PO TBDP
8.0000 mg | ORAL_TABLET | Freq: Once | ORAL | Status: AC
  Administered 2023-12-17: 8 mg via ORAL
  Filled 2023-12-17: qty 2

## 2023-12-17 MED ORDER — ONDANSETRON 4 MG PO TBDP
ORAL_TABLET | ORAL | Status: AC
  Filled 2023-12-17: qty 1

## 2023-12-17 MED ORDER — ACETAMINOPHEN 325 MG PO TABS
650.0000 mg | ORAL_TABLET | Freq: Once | ORAL | Status: AC
  Administered 2023-12-17: 650 mg via ORAL
  Filled 2023-12-17: qty 2

## 2023-12-17 NOTE — ED Notes (Signed)
 Pt verbalized understanding to DC instructions. Pt out of ED with all belongings and paperwork not in visible distress.  Rx sent to pt preferred pharmacy.

## 2023-12-17 NOTE — ED Triage Notes (Addendum)
 Pt woke up with chills, light headed. States Hx of Sepsis. Pt states also abdominal pain and diarrhea before bed last night.

## 2023-12-17 NOTE — ED Provider Notes (Signed)
 [facility] Provider Note   CSN: [id number] Arrival date & time: 12/17/23  0400     Patient presents with: Chills and Back Pain   Kristen Kline is a 38 y.o. female.   The history is provided by the patient.  Back Pain Location:  Sacro-iliac joint Pain severity:  Moderate Pain is:  Same all the time Timing:  Constant Progression:  Unchanged Chronicity:  New Context: not emotional stress, not falling, not jumping from heights and not lifting heavy objects   Relieved by:  Nothing Worsened by:  Nothing Ineffective treatments:  None tried Associated symptoms: no abdominal pain, no chest pain, no dysuria, no fever, no leg pain, no numbness, no paresthesias, no tingling, no weakness and no weight loss   Risk factors: no hx of cancer        Prior to Admission medications   Medication Sig Start Date End Date Taking? Authorizing Provider  dicyclomine  (BENTYL ) 20 MG tablet Take 1 tablet (20 mg total) by mouth 2 (two) times daily. 12/17/23  Yes Wes Lezotte, MD  ondansetron  (ZOFRAN ) 4 MG tablet Take 1 tablet (4 mg total) by mouth every 8 (eight) hours as needed for nausea or vomiting. 12/17/23  Yes Teagyn Fishel, MD  acetaminophen  (TYLENOL ) 500 MG tablet Take 1,000 mg by mouth 3 (three) times daily as needed for moderate pain.    [provider]  azithromycin  (ZITHROMAX  Z-PAK) 250 MG tablet Take 2 tablets (500 mg) PO today, then 1 tablet (250 mg) PO daily x4 days. 04/15/23   Melvenia Manus BRAVO, MD  Iron , Ferrous Sulfate , 325 (65 Fe) MG TABS Take 325 mg by mouth daily. 09/03/22   Del Orbe Polanco, Iliana, FNP    Allergies: Patient has no known allergies.    Review of Systems  Constitutional:  Negative for fever and weight loss.  Respiratory:  Negative for wheezing and stridor.   Cardiovascular:  Negative for chest pain.  Gastrointestinal:  Positive for diarrhea. Negative for abdominal pain, nausea and vomiting.  Genitourinary:   Negative for dysuria.  Musculoskeletal:  Positive for back pain.  Neurological:  Negative for tingling, weakness, numbness and paresthesias.  All other systems reviewed and are negative.   Updated Vital Signs BP 126/84   Pulse 87   Temp 98.8 F (37.1 C) (Oral)   Resp 18   Ht 5' 6 (1.676 m)   Wt 127 kg   LMP 12/03/2023 (Approximate)   SpO2 97%   BMI 45.19 kg/m   Physical Exam Vitals and nursing note reviewed.  Constitutional:      General: She is not in acute distress.    Appearance: Normal appearance. She is well-developed.  HENT:     Head: Normocephalic and atraumatic.     Nose: Nose normal.  Eyes:     Pupils: Pupils are equal, round, and reactive to light.  Cardiovascular:     Rate and Rhythm: Normal rate and regular rhythm.     Pulses: Normal pulses.     Heart sounds: Normal heart sounds.  Pulmonary:     Effort: Pulmonary effort is normal. No respiratory distress.     Breath sounds: Normal breath sounds.  Abdominal:     General: Bowel sounds are normal. There is no distension.     Palpations: Abdomen is soft.     Tenderness: There is no abdominal tenderness. There is no guarding or rebound.  Musculoskeletal:        General: Normal range  of motion.     Cervical back: Normal range of motion and neck supple.  Skin:    General: Skin is dry.     Capillary Refill: Capillary refill takes less than 2 seconds.     Findings: No erythema or rash.  Neurological:     General: No focal deficit present.     Mental Status: She is alert.     Deep Tendon Reflexes: Reflexes normal.  Psychiatric:        Mood and Affect: Mood normal.     (all labs ordered are listed, but only abnormal results are displayed) Labs Reviewed  RESP PANEL BY RT-PCR (RSV, FLU A&B, COVID)  [id number]  URINALYSIS, ROUTINE W REFLEX MICROSCOPIC    EKG: None  Radiology: No results found.   Procedures   Medications Ordered in the ED  ondansetron  (ZOFRAN -ODT) 4 MG disintegrating tablet (has no  administration in time range)  acetaminophen  (TYLENOL ) tablet 650 mg (650 mg Oral Given 12/17/23 0541)  dicyclomine  (BENTYL ) capsule 10 mg (10 mg Oral Given 12/17/23 0540)  ondansetron  (ZOFRAN -ODT) disintegrating tablet 8 mg (8 mg Oral Given 12/17/23 0541)                                    Medical Decision Making Patient with diarrhea and body aches a  Amount and/or Complexity of Data Reviewed Independent Historian: spouse    Details: See above  External Data Reviewed: notes.    Details: Previous notes reviewed  Labs: ordered.    Details: Negative covid and flu urine is negative for UTI  Risk OTC drugs. Prescription drug management. Risk Details: Patient with viral symptoms.  No signs of sepsis.  Very well appearing with normal vitals.  Stable for discharge.  Strict returns.        Final diagnoses:  Diarrhea, unspecified type  Viral illness   No signs of systemic illness or infection. The patient is nontoxic-appearing on exam and vital signs are within normal limits.  I have reviewed the triage vital signs and the nursing notes. Pertinent labs & imaging results that were available during my care of the patient were reviewed by me and considered in my medical decision making (see chart for details). After history, exam, and medical workup I feel the patient has been appropriately medically screened and is safe for discharge home. Pertinent diagnoses were discussed with the patient. Patient was given return precautions.    ED Discharge Orders          Ordered    ondansetron  (ZOFRAN ) 4 MG tablet  Every 8 hours PRN        12/17/23 0530    dicyclomine  (BENTYL ) 20 MG tablet  2 times daily        12/17/23 0530               Dalesha Stanback, MD 12/17/23 [id number]

## 2024-03-12 ENCOUNTER — Ambulatory Visit: Payer: Self-pay

## 2024-03-12 NOTE — Telephone Encounter (Signed)
 FYI Only or Action Required?: FYI only for provider.  Patient was last seen in primary care on 04/15/2023 by Melvenia Manus BRAVO, MD.  Called Nurse Triage reporting Urinary Frequency.  Symptoms began a week ago.  Interventions attempted: Nothing.  Symptoms are: stable.  Triage Disposition: See Physician Within 24 Hours  Patient/caregiver understands and will follow disposition?: Yes Reason for Disposition  All other patients with painful urination  (Exception: [1] EITHER frequency or urgency AND [2] has on-call doctor.)    No on call doctor.  Answer Assessment - Initial Assessment Questions Patient not established, advised UC for symptoms today, patient said ok. Set up new pt appointment 10/27  1. SEVERITY: How bad is the pain?  (e.g., Scale 1-10; mild, moderate, or severe)     Mild  2. FREQUENCY: How many times have you had painful urination today?      1 or 2 times today  3. PATTERN: Is pain present every time you urinate or just sometimes?      Burning, but not every time  4. ONSET: When did the painful urination start?      A week and a half ago  5. FEVER: Do you have a fever? If Yes, ask: What is your temperature, how was it measured, and when did it start?     No  6. PAST UTI: Have you had a urine infection before? If Yes, ask: When was the last time? and What happened that time?      Got them before, gotten a kidney infection prior  7. CAUSE: What do you think is causing the painful urination?  (e.g., UTI, scratch, Herpes sore)     UTI  8. OTHER SYMPTOMS: Do you have any other symptoms? (e.g., blood in urine, flank pain, genital sores, urgency, vaginal discharge)     Urgency and not a lot is coming out, lower back pain, but on menstrual cycle so unsure if that's correlated. Night sweats and feeling cold  Protocols used: Urination Pain - Female-A-AH  Copied from CRM [id number]. Topic: Clinical - Red Word Triage >> Mar 12, 2024 12:28 PM Emylou G  wrote: [facility] that prompted transfer to Nurse Triage: UTI?  pain when she pees, urgency, lower left side back pain, and feeling cold

## 2024-04-02 NOTE — Progress Notes (Deleted)
 Subjective:  Patient ID: Kristen Kline, female    DOB: June 20, 1985, 38 y.o.   MRN: [id number]  Patient Care Team: Pcp, No as PCP - General   Chief Complaint:  No chief complaint on file.   HPI: Kristen Kline is a 38 y.o. female presenting on 04/06/2024 for No chief complaint on file.   Discussed the use of AI scribe software for clinical note transcription with the patient, who gave verbal consent to proceed.  History of Present Illness       Relevant past medical, surgical, family, and social history reviewed and updated as indicated.  Allergies and medications reviewed and updated. Data reviewed: Chart in Epic.   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   Sepsis due to urinary tract infection (HCC) 06/11/2022   UTI (lower urinary tract infection) 05/30/2015    Past Surgical History:  Procedure Laterality Date   CESAREAN SECTION N/A    [employer] 11/17/2019   CHOLECYSTECTOMY     WISDOM TOOTH EXTRACTION      Social History   Socioeconomic History   Marital status: Married    Spouse name: Not on file   Number of children: Not on file   Years of education: Not on file   Highest education level: Not on file  Occupational History   Not on file  Tobacco Use   Smoking status: Former   Smokeless tobacco: Never  Vaping Use   Vaping status: Never Used  Substance and Sexual Activity   Alcohol use: No   Drug use: No   Sexual activity: Yes    Birth control/protection: None  Other Topics Concern   Not on file  Social History Narrative   Not on file   Social Drivers of Health   Financial Resource Strain: Medium Risk (11/16/2021)   Overall Financial Resource Strain  (CARDIA)    Difficulty of Paying Living Expenses: Somewhat hard  Food Insecurity: Food Insecurity Present (11/16/2021)   Hunger Vital Sign    Worried About Running Out of Food in the Last Year: Sometimes true    Ran Out of Food in the Last Year: Never true  Transportation Needs: No Transportation Needs (11/16/2021)   PRAPARE - Administrator, Civil Service (Medical): No    Lack of Transportation (Non-Medical): No  Physical Activity: Insufficiently Active (11/16/2021)   Exercise Vital Sign    Days of Exercise per Week: 4 days    Minutes of Exercise per Session: 20 min  Stress: No Stress Concern Present (11/16/2021)   [employer] of Occupational Health - Occupational Stress Questionnaire    Feeling of Stress : Not at all  Social Connections: Socially Integrated (11/16/2021)   Social Connection and Isolation Panel    Frequency of Communication with Friends and Family: More than three times a week    Frequency of Social Gatherings with Friends and Family: Once a week    Attends Religious Services: More than 4 times per year    Active Member of [employer] or Organizations: No    Attends Engineer, structural: 1 to 4 times per year    Marital Status: Married  Catering manager Violence: Not At  Risk (11/16/2021)   Humiliation, Afraid, Rape, and Kick questionnaire    Fear of Current or Ex-Partner: No    Emotionally Abused: No    Physically Abused: No    Sexually Abused: No    Outpatient Encounter Medications as of 04/06/2024  Medication Sig   acetaminophen  (TYLENOL ) 500 MG tablet Take 1,000 mg by mouth 3 (three) times daily as needed for moderate pain.   azithromycin  (ZITHROMAX  Z-PAK) 250 MG tablet Take 2 tablets (500 mg) PO today, then 1 tablet (250 mg) PO daily x4 days.   dicyclomine  (BENTYL ) 20 MG tablet Take 1 tablet (20 mg total) by mouth 2 (two) times daily.   Iron , Ferrous Sulfate , 325 (65 Fe) MG TABS Take 325 mg by mouth daily.   ondansetron  (ZOFRAN ) 4 MG tablet Take 1  tablet (4 mg total) by mouth every 8 (eight) hours as needed for nausea or vomiting.   No facility-administered encounter medications on file as of 04/06/2024.    No Known Allergies  Pertinent ROS per HPI, otherwise unremarkable      Objective:  There were no vitals taken for this visit.   Wt Readings from Last 3 Encounters:  12/17/23 280 lb (127 kg)  05/11/23 290 lb (131.5 kg)  04/15/23 (!) 326 lb (147.9 kg)    Physical Exam Physical Exam      Results for orders placed or performed during the hospital encounter of 12/17/23  Resp panel by RT-PCR (RSV, Flu A&B, Covid) Anterior Nasal Swab   Collection Time: 12/17/23  4:13 AM   Specimen: Anterior Nasal Swab  Result Value Ref Range   SARS Coronavirus 2 by RT PCR NEGATIVE NEGATIVE   Influenza A by PCR NEGATIVE NEGATIVE   Influenza B by PCR NEGATIVE NEGATIVE   Resp Syncytial Virus by PCR NEGATIVE NEGATIVE  Urinalysis, Routine w reflex microscopic -Urine, Clean Catch   Collection Time: 12/17/23  5:03 AM  Result Value Ref Range   Color, Urine YELLOW YELLOW   APPearance CLEAR CLEAR   Specific Gravity, Urine 1.025 1.005 - 1.030   pH 5.5 5.0 - 8.0   Glucose, UA NEGATIVE NEGATIVE mg/dL   Hgb urine dipstick NEGATIVE NEGATIVE   Bilirubin Urine NEGATIVE NEGATIVE   Ketones, ur NEGATIVE NEGATIVE mg/dL   Protein, ur NEGATIVE NEGATIVE mg/dL   Nitrite NEGATIVE NEGATIVE   Leukocytes,Ua NEGATIVE NEGATIVE       Pertinent labs & imaging results that were available during my care of the patient were reviewed by me and considered in my medical decision making.  Assessment & Plan:  There are no diagnoses linked to this encounter.   Assessment and Plan Assessment & Plan       Continue all other maintenance medications.  Follow up plan: No follow-ups on file.   Continue healthy lifestyle choices, including diet (rich in fruits, vegetables, and lean proteins, and low in salt and simple carbohydrates) and exercise (at least 30  minutes of moderate physical activity daily).  Educational handout given for ***  The above assessment and management plan was discussed with the patient. The patient verbalized understanding of and has agreed to the management plan. Patient is aware to call the clinic if they develop any new symptoms or if symptoms persist or worsen. Patient is aware when to return to the clinic for a follow-up visit. Patient educated on when it is appropriate to go to the emergency department.   Isaia Hassell [place] Thompson, DNP [facility] [address] [place],  [place] [address] [phone]

## 2024-04-06 ENCOUNTER — Encounter: Payer: Self-pay | Admitting: Nurse Practitioner

## 2024-04-06 DIAGNOSIS — Z0001 Encounter for general adult medical examination with abnormal findings: Secondary | ICD-10-CM

## 2024-05-19 NOTE — Progress Notes (Unsigned)
 Subjective:  Patient ID: Kristen Kline, female    DOB: 1986-02-12, 38 y.o.   MRN: [id number]  Patient Care Team: Pcp, No as PCP - General   Chief Complaint:  No chief complaint on file.   HPI: Kristen Kline is a 38 y.o. female presenting on 05/20/2024 for No chief complaint on file.   Discussed the use of AI scribe software for clinical note transcription with the patient, who gave verbal consent to proceed.  History of Present Illness       Relevant past medical, surgical, family, and social history reviewed and updated as indicated.  Allergies and medications reviewed and updated. Data reviewed: Chart in Epic.   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   Sepsis due to urinary tract infection (HCC) 06/11/2022   UTI (lower urinary tract infection) 05/30/2015    Past Surgical History:  Procedure Laterality Date   CESAREAN SECTION N/A    [employer] 11/17/2019   CHOLECYSTECTOMY     WISDOM TOOTH EXTRACTION      Social History   Socioeconomic History   Marital status: Married    Spouse name: Not on file   Number of children: Not on file   Years of education: Not on file   Highest education level: Not on file  Occupational History   Not on file  Tobacco Use   Smoking status: Former   Smokeless tobacco: Never  Vaping Use   Vaping status: Never Used  Substance and Sexual Activity   Alcohol use: No   Drug use: No   Sexual activity: Yes    Birth control/protection: None  Other Topics Concern   Not on file  Social History Narrative   Not on file   Social Drivers of Health   Financial Resource Strain: Medium Risk (11/16/2021)   Overall Financial Resource Strain  (CARDIA)    Difficulty of Paying Living Expenses: Somewhat hard  Food Insecurity: Food Insecurity Present (11/16/2021)   Hunger Vital Sign    Worried About Running Out of Food in the Last Year: Sometimes true    Ran Out of Food in the Last Year: Never true  Transportation Needs: No Transportation Needs (11/16/2021)   PRAPARE - Administrator, Civil Service (Medical): No    Lack of Transportation (Non-Medical): No  Physical Activity: Insufficiently Active (11/16/2021)   Exercise Vital Sign    Days of Exercise per Week: 4 days    Minutes of Exercise per Session: 20 min  Stress: No Stress Concern Present (11/16/2021)   [employer] of Occupational Health - Occupational Stress Questionnaire    Feeling of Stress : Not at all  Social Connections: Socially Integrated (11/16/2021)   Social Connection and Isolation Panel    Frequency of Communication with Friends and Family: More than three times a week    Frequency of Social Gatherings with Friends and Family: Once a week    Attends Religious Services: More than 4 times per year    Active Member of [employer] or Organizations: No    Attends Engineer, Structural: 1 to 4 times per year    Marital Status: Married  Catering Manager Violence: Not At  Risk (11/16/2021)   Humiliation, Afraid, Rape, and Kick questionnaire    Fear of Current or Ex-Partner: No    Emotionally Abused: No    Physically Abused: No    Sexually Abused: No    Outpatient Encounter Medications as of 05/20/2024  Medication Sig   acetaminophen  (TYLENOL ) 500 MG tablet Take 1,000 mg by mouth 3 (three) times daily as needed for moderate pain.   azithromycin  (ZITHROMAX  Z-PAK) 250 MG tablet Take 2 tablets (500 mg) PO today, then 1 tablet (250 mg) PO daily x4 days.   dicyclomine  (BENTYL ) 20 MG tablet Take 1 tablet (20 mg total) by mouth 2 (two) times daily.   Iron , Ferrous Sulfate , 325 (65 Fe) MG TABS Take 325 mg by mouth daily.   ondansetron  (ZOFRAN ) 4 MG tablet Take 1  tablet (4 mg total) by mouth every 8 (eight) hours as needed for nausea or vomiting.   No facility-administered encounter medications on file as of 05/20/2024.    No Known Allergies  Pertinent ROS per HPI, otherwise unremarkable      Objective:  There were no vitals taken for this visit.   Wt Readings from Last 3 Encounters:  12/17/23 280 lb (127 kg)  05/11/23 290 lb (131.5 kg)  04/15/23 (!) 326 lb (147.9 kg)    Physical Exam Physical Exam      Results for orders placed or performed during the hospital encounter of 12/17/23  Resp panel by RT-PCR (RSV, Flu A&B, Covid) Anterior Nasal Swab   Collection Time: 12/17/23  4:13 AM   Specimen: Anterior Nasal Swab  Result Value Ref Range   SARS Coronavirus 2 by RT PCR NEGATIVE NEGATIVE   Influenza A by PCR NEGATIVE NEGATIVE   Influenza B by PCR NEGATIVE NEGATIVE   Resp Syncytial Virus by PCR NEGATIVE NEGATIVE  Urinalysis, Routine w reflex microscopic -Urine, Clean Catch   Collection Time: 12/17/23  5:03 AM  Result Value Ref Range   Color, Urine YELLOW YELLOW   APPearance CLEAR CLEAR   Specific Gravity, Urine 1.025 1.005 - 1.030   pH 5.5 5.0 - 8.0   Glucose, UA NEGATIVE NEGATIVE mg/dL   Hgb urine dipstick NEGATIVE NEGATIVE   Bilirubin Urine NEGATIVE NEGATIVE   Ketones, ur NEGATIVE NEGATIVE mg/dL   Protein, ur NEGATIVE NEGATIVE mg/dL   Nitrite NEGATIVE NEGATIVE   Leukocytes,Ua NEGATIVE NEGATIVE       Pertinent labs & imaging results that were available during my care of the patient were reviewed by me and considered in my medical decision making.  Assessment & Plan:  There are no diagnoses linked to this encounter.   Assessment and Plan Assessment & Plan       Continue all other maintenance medications.  Follow up plan: No follow-ups on file.   Continue healthy lifestyle choices, including diet (rich in fruits, vegetables, and lean proteins, and low in salt and simple carbohydrates) and exercise (at least 30  minutes of moderate physical activity daily).  Educational handout given for ***  The above assessment and management plan was discussed with the patient. The patient verbalized understanding of and has agreed to the management plan. Patient is aware to call the clinic if they develop any new symptoms or if symptoms persist or worsen. Patient is aware when to return to the clinic for a follow-up visit. Patient educated on when it is appropriate to go to the emergency department.   Kristen Peeks [place] Thompson, DNP [facility] [address] [place],  [place] [address] [phone]

## 2024-05-20 ENCOUNTER — Encounter: Payer: Self-pay | Admitting: Nurse Practitioner

## 2024-05-20 DIAGNOSIS — Z0001 Encounter for general adult medical examination with abnormal findings: Secondary | ICD-10-CM

## 2024-05-20 DIAGNOSIS — E559 Vitamin D deficiency, unspecified: Secondary | ICD-10-CM

## 2024-05-20 DIAGNOSIS — E7841 Elevated Lipoprotein(a): Secondary | ICD-10-CM

## 2024-05-27 ENCOUNTER — Other Ambulatory Visit: Payer: Self-pay

## 2024-05-27 ENCOUNTER — Encounter (HOSPITAL_BASED_OUTPATIENT_CLINIC_OR_DEPARTMENT_OTHER): Payer: Self-pay | Admitting: Emergency Medicine

## 2024-05-27 ENCOUNTER — Emergency Department (HOSPITAL_BASED_OUTPATIENT_CLINIC_OR_DEPARTMENT_OTHER)

## 2024-05-27 ENCOUNTER — Emergency Department (HOSPITAL_BASED_OUTPATIENT_CLINIC_OR_DEPARTMENT_OTHER)
Admission: EM | Admit: 2024-05-27 | Discharge: 2024-05-27 | Disposition: A | Discharge status: Home / Self Care | Source: Home / Self Care | Attending: Emergency Medicine | Admitting: Emergency Medicine

## 2024-05-27 DIAGNOSIS — M79672 Pain in left foot: Secondary | ICD-10-CM | POA: Diagnosis present

## 2024-05-27 NOTE — ED Triage Notes (Signed)
 Pt c/o LT foot pain; sts she has been using it more d/t RT ankle sprain

## 2024-05-27 NOTE — Discharge Instructions (Signed)
 As we discussed, your workup in the ER today was reassuring for acute findings.  X-ray imaging did not reveal any fracture or dislocation of your foot.  However you likely have an sprain or a strain.  We have given you a boot to wear for support.  I recommend the rest, ice, compress, and elevate your foot and take Tylenol /ibuprofen  as needed for pain.  Please use acetaminophen  (Tylenol ) or ibuprofen  (Advil , Motrin ) for pain.  You may use 800 mg ibuprofen  every 6 hours or 1000 mg of acetaminophen  every 6 hours.  You may choose to alternate between the two, this would be most effective. Do not exceed 4000 mg of acetaminophen  within 24 hours.  Do not exceed 3200 mg ibuprofen  within 24 hours.  Additionally, I have given you a referral to orthopedics with number to call to schedule appoint for follow-up.  Please call as needed if your pain is not improving.  You can take the boot off to shower and sleep.  When your pain improves, recommend downgrading to a brace as the faster you are able to use your ankle the faster you will likely recover.  Return if development of any new or worsening symptoms

## 2024-05-27 NOTE — ED Provider Notes (Signed)
 Kristen Kline AT [facility] [place] Provider Note   CSN: [id number] Arrival date & time: 05/27/24  8073     Patient presents with: Foot Pain   Kristen Kline is a 38 y.o. female.   Patient with history of morbid obesity presents today with complaints of left foot pain. Reports that she sprained her right ankle right before Thanksgiving and has been wearing a boot for this. She reports she has been favoring the left foot more due to this injury. A few days ago she notes that she got off balance and stepped back on her left foot on concrete and has been having persistent pain since then.  She did not fall and denies any other injuries.  The history is provided by the patient. No language interpreter was used.  Foot Pain       Prior to Admission medications  Medication Sig Start Date End Date Taking? Authorizing Provider  acetaminophen  (TYLENOL ) 500 MG tablet Take 1,000 mg by mouth 3 (three) times daily as needed for moderate pain.    [provider]  azithromycin  (ZITHROMAX  Z-PAK) 250 MG tablet Take 2 tablets (500 mg) PO today, then 1 tablet (250 mg) PO daily x4 days. 04/15/23   Melvenia Manus BRAVO, MD  dicyclomine  (BENTYL ) 20 MG tablet Take 1 tablet (20 mg total) by mouth 2 (two) times daily. 12/17/23   Palumbo, April, MD  Iron , Ferrous Sulfate , 325 (65 Fe) MG TABS Take 325 mg by mouth daily. 09/03/22   Del Orbe Polanco, Iliana, FNP  ondansetron  (ZOFRAN ) 4 MG tablet Take 1 tablet (4 mg total) by mouth every 8 (eight) hours as needed for nausea or vomiting. 12/17/23   Palumbo, April, MD    Allergies: Patient has no known allergies.    Review of Systems  Musculoskeletal:  Positive for myalgias.  All other systems reviewed and are negative.   Updated Vital Signs BP (!) 132/96 (BP Location: Left Wrist)   Pulse 92   Temp 98.6 F (37 C)   Resp 20   Ht 5' 6 (1.676 m)   Wt 133.8 kg   SpO2 98%   BMI 47.61 kg/m   Physical Exam Vitals and nursing  note reviewed.  Constitutional:      General: She is not in acute distress.    Appearance: Normal appearance. She is normal weight. She is not ill-appearing, toxic-appearing or diaphoretic.  HENT:     Head: Normocephalic and atraumatic.  Cardiovascular:     Rate and Rhythm: Normal rate.  Pulmonary:     Effort: Pulmonary effort is normal. No respiratory distress.  Musculoskeletal:        General: Normal range of motion.     Cervical back: Normal range of motion.     Comments: Swelling noted throughout the left foot, tenderness noted to the dorsal aspect of the foot. No deformity.  Achilles tendon is patent.  Compartments are soft.  DP and PT pulses are intact and 2+.  Good capillary refill and distal sensation.  Intact dorsi and plantarflexion.  Skin:    General: Skin is warm and dry.  Neurological:     General: No focal deficit present.     Mental Status: She is alert.  Psychiatric:        Mood and Affect: Mood normal.        Behavior: Behavior normal.     (all labs ordered are listed, but only abnormal results are displayed) Labs Reviewed - No data to  display  EKG: None  Radiology: DG Foot Complete Left Result Date: 05/27/2024 EXAM: 3 VIEW(S) XRAY OF THE LEFT FOOT 05/27/2024 08:30:00 PM COMPARISON: None available. CLINICAL HISTORY: pain FINDINGS: BONES AND JOINTS: No acute fracture. No malalignment. SOFT TISSUES: Moderate soft tissue swelling throughout the forefoot. IMPRESSION: 1. Moderate soft tissue swelling throughout the forefoot. Electronically signed by: Dorethia Molt MD 05/27/2024 08:34 PM EST RP Workstation: [id number]     Procedures   Medications Ordered in the ED - No data to display                                  Medical Decision Making Amount and/or Complexity of Data Reviewed Radiology: ordered.   Kristen Kline is a 38 y.o. female who presents with complaints of left foot injury which occurred a few days ago. They are afebrile,  nontoxic-appearing, and in no acute distress reassuring vital signs. Physical exam reveals Swelling noted throughout the left foot, tenderness noted to the dorsal aspect of the foot. No deformity.  Achilles tendon is patent.  Compartments are soft.  DP and PT pulses are intact and 2+.  Good capillary refill and distal sensation.  Intact dorsi and plantarflexion. X-ray imaging ordered and obtained which has resulted and reveals  1. Moderate soft tissue swelling throughout the forefoot.   I have personally reviewed and interpreted this imaging and agree with radiology interpretation  Discussed findings with patient.  Patient provided with CAM boot per her request. Home care instructions including RICE and NSAID's discussed. Follow up with ortho if symptoms not improving in 1 week.  Referral given for same.  Evaluation and diagnostic testing in the emergency Kline does not suggest an emergent condition requiring admission or immediate intervention beyond what has been performed at this time.  Plan for discharge with close PCP follow-up.  Patient is understanding and amenable with plan, educated on red flag symptoms that would prompt immediate return.  Patient discharged in stable condition.  Final diagnoses:  Foot pain, left    ED Discharge Orders     None     An After Visit Summary was printed and given to the patient.      Shakeila Pfarr A, PA-C 05/27/24 2226    Tegeler, Lonni PARAS, MD 05/28/24 [phone]

## 2024-06-25 ENCOUNTER — Ambulatory Visit: Admitting: Orthopedic Surgery

## 2024-06-25 ENCOUNTER — Encounter: Payer: Self-pay | Admitting: Orthopedic Surgery

## 2024-06-25 VITALS — BP 133/93 | Ht 66.0 in | Wt 295.0 lb

## 2024-06-25 DIAGNOSIS — S93602A Unspecified sprain of left foot, initial encounter: Secondary | ICD-10-CM

## 2024-06-25 DIAGNOSIS — S93401S Sprain of unspecified ligament of right ankle, sequela: Secondary | ICD-10-CM

## 2024-06-25 MED ORDER — IBUPROFEN 800 MG PO TABS: 800.0000 mg | ORAL_TABLET | Freq: Three times a day (TID) | ORAL | 1 refills | Status: AC | PRN

## 2024-06-25 NOTE — Progress Notes (Signed)
 "  EMERGENCY ROOM FOLLOW UP  NEW PROBLEM/PATIENT       Office Visit Note   Patient: Kristen Kline           Date of Birth: 1986-03-24           MRN: [id number] Visit Date: 06/25/2024 Requested by: No referring provider defined for this encounter. PCP: Patient, No Pcp Per  Subjective:  Emergency room record from (date) May 27, 2024 has been reviewed and this is included by reference and includes the review of systems with the following addition:   Chief Complaint  Patient presents with   Foot Pain    Left ER 05/27/24    HPI: This is a 39 year old female comes in after emergency room evaluation for problems with her left foot  She was previously evaluated for evaluation of the right foot secondary to an ankle injury back in November 2025 she was placed in a cam walker boot  She injured her left foot going down some steps she hyper extended the MTP joints and had dorsiflexion type injury to the foot  She did this coming down the steps.  The next day she felt pain.  She started having swelling went to the emergency room  She has pain in the midfoot with swelling  Her right foot and ankle seem to be improving  Although they gave her a boot for the left foot in the ER she cannot walk with 2 boots                ROS: Negative  Assessment & Plan:  Are there any outside images performed by another physician or qualified health professional not separately reported: Yes  X-ray left foot    Visit Diagnoses:  1. Foot sprain, left, initial encounter   2. Sprain of right ankle, unspecified ligament, sequela     Plan:  no evidence of fracture.  Recommend that she use a boot for the left foot she can take the boot off of the right foot  She can start weightbearing as tolerated with a walker to support the left foot.  She can come back in 3 weeks for recheck  Allergies[1] Current Outpatient Medications  Medication Instructions   acetaminophen  (TYLENOL ) 1,000  mg, Oral, 3 times daily PRN   azithromycin  (ZITHROMAX  Z-PAK) 250 MG tablet Take 2 tablets (500 mg) PO today, then 1 tablet (250 mg) PO daily x4 days.   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   Past Surgical History:  Procedure Laterality Date   CESAREAN SECTION N/A    [employer] 11/17/2019   CHOLECYSTECTOMY     WISDOM TOOTH EXTRACTION      Family History  Problem Relation Age of Onset   Heart failure Mother    Diabetes Mother    Hypertension Father    Stroke Paternal Grandfather    Stroke Paternal Grandmother     Social History Social History[2]  Allergies[3]  Current Outpatient Medications  Medication Sig Dispense Refill   ibuprofen  (ADVIL ) 800 MG tablet Take 1 tablet (800 mg total) by mouth every 8 (eight) hours as needed. 90 tablet 1   acetaminophen  (TYLENOL ) 500 MG tablet Take 1,000 mg by mouth 3 (three) times daily as needed for moderate pain.     azithromycin  (ZITHROMAX  Z-PAK) 250 MG tablet Take 2 tablets (500 mg) PO today, then 1 tablet (250 mg) PO daily x4 days. 6 tablet 0   dicyclomine  (BENTYL ) 20 MG tablet Take 1 tablet (20 mg total) by mouth 2 (two) times daily. 20 tablet 0   Iron , Ferrous Sulfate , 325 (65 Fe) MG TABS Take 325 mg by mouth daily. 30 tablet 2   ondansetron  (ZOFRAN ) 4 MG tablet Take 1 tablet (4 mg total) by mouth every 8 (eight) hours as needed for  nausea or vomiting. 12 tablet 0   No current facility-administered medications for this visit.    Physical Exam BP (!) 133/93 Comment: 05/27/24  Ht 5' 6 (1.676 m)   Wt 295 lb (133.8 kg)   BMI 47.61 kg/m  Body mass index is 47.61 kg/m.  Well developed and well nourished   Alert and oriented x 3  Normal affect and mood   Right foot  Skin is warm dry and intact.  No areas of tenderness to palpation.  Range of motion is normal ankle stability normal Achilles tendon is intact midfoot stability is normal and there is no tenderness over the dorsal aspect of the foot  Left foot  Skin is warm dry and intact  The area of tenderness is approximately in the midfoot but the midfoot is stable range of motion of the ankle is normal there is no Achilles tendon defect.  Neurovascular exam is normal    [1] No Known Allergies [2]  Social History Tobacco Use   Smoking status: Former   Smokeless tobacco: Never  Vaping Use   Vaping status: Never Used  Substance Use Topics   Alcohol use: No   Drug use: No  [3] No Known Allergies  "

## 2024-06-25 NOTE — Addendum Note (Signed)
 Addended by: MARGRETTE TAFT BRAVO on: 06/25/2024 04:33 PM   Modules accepted: Level of Service

## 2024-06-25 NOTE — Progress Notes (Signed)
" °  Intake history:  Chief Complaint  Patient presents with   Foot Pain    Left ER 05/27/24     BP (!) 133/93 Comment: 05/27/24  Ht 5' 6 (1.676 m)   Wt 295 lb (133.8 kg)   BMI 47.61 kg/m  Body mass index is 47.61 kg/m.  Pharmacy? ______[facility] [address]________________________________  WHAT ARE WE SEEING YOU FOR TODAY?  Right ankle / left foot  How long has this bothered you? (DOI?DOS?WS?)  on November 2025 started having pain after stepping on uneven ground did not fall or twist, just stepped on uneven surface, pain ankle right then she started having left foot pain Dec 2025  Was there an injury? No   Anticoag.  No   Any ALLERGIES _______Allergies[1] _______________________________________   Treatment:  Have you taken:  Tylenol  No  Advil  Yes  Had PT No  Had injection No  Other  ________has cam walker boots for both lower ext. _________________        [1] No Known Allergies  "

## 2024-06-25 NOTE — Patient Instructions (Addendum)
 Start weight bearing at home with a walker for  the left side   use the knee walker for long walking periods  Increase walking with  weight on the left foot and the walker as tolerated

## 2024-07-16 ENCOUNTER — Ambulatory Visit: Admitting: Orthopedic Surgery

## 2024-07-29 ENCOUNTER — Ambulatory Visit: Admitting: Orthopedic Surgery
